# Patient Record
Sex: Male | Born: 1960
Health system: Southern US, Community
[De-identification: ages and names within clinical notes are randomized; demographics above are authoritative.]

## PROBLEM LIST (undated history)

## (undated) DIAGNOSIS — I10 Essential (primary) hypertension: Secondary | ICD-10-CM

## (undated) DIAGNOSIS — I639 Cerebral infarction, unspecified: Secondary | ICD-10-CM

## (undated) DIAGNOSIS — I480 Paroxysmal atrial fibrillation: Secondary | ICD-10-CM

## (undated) DIAGNOSIS — I249 Acute ischemic heart disease, unspecified: Secondary | ICD-10-CM

## (undated) DIAGNOSIS — E119 Type 2 diabetes mellitus without complications: Secondary | ICD-10-CM

## (undated) DIAGNOSIS — M47816 Spondylosis without myelopathy or radiculopathy, lumbar region: Secondary | ICD-10-CM

## (undated) DIAGNOSIS — F1910 Other psychoactive substance abuse, uncomplicated: Secondary | ICD-10-CM

## (undated) DIAGNOSIS — I2510 Atherosclerotic heart disease of native coronary artery without angina pectoris: Secondary | ICD-10-CM

## (undated) DIAGNOSIS — E785 Hyperlipidemia, unspecified: Secondary | ICD-10-CM

## (undated) HISTORY — DX: Cerebral infarction, unspecified: I63.9

## (undated) HISTORY — DX: Essential (primary) hypertension: I10

## (undated) HISTORY — DX: Paroxysmal atrial fibrillation: I48.0

## (undated) HISTORY — DX: Type 2 diabetes mellitus without complications: E11.9

## (undated) HISTORY — DX: Acute ischemic heart disease, unspecified: I24.9

## (undated) HISTORY — DX: Atherosclerotic heart disease of native coronary artery without angina pectoris: I25.10

## (undated) HISTORY — DX: Other psychoactive substance abuse, uncomplicated: F19.10

## (undated) HISTORY — PX: LYMPH NODE BIOPSY: SHX201

## (undated) HISTORY — DX: Spondylosis without myelopathy or radiculopathy, lumbar region: M47.816

## (undated) HISTORY — DX: Hyperlipidemia, unspecified: E78.5

---

## 2003-02-24 ENCOUNTER — Emergency Department (HOSPITAL_COMMUNITY): Admission: EM | Admit: 2003-02-24 | Discharge: 2003-02-24 | Payer: Self-pay | Admitting: Emergency Medicine

## 2013-03-15 ENCOUNTER — Encounter: Payer: Self-pay | Admitting: Family Medicine

## 2013-03-15 ENCOUNTER — Ambulatory Visit (INDEPENDENT_AMBULATORY_CARE_PROVIDER_SITE_OTHER): Payer: 59 | Admitting: Family Medicine

## 2013-03-15 VITALS — BP 130/70 | HR 78 | Temp 97.4°F | Resp 20 | Ht 72.0 in | Wt 220.0 lb

## 2013-03-15 DIAGNOSIS — M2540 Effusion, unspecified joint: Secondary | ICD-10-CM

## 2013-03-15 DIAGNOSIS — F17200 Nicotine dependence, unspecified, uncomplicated: Secondary | ICD-10-CM

## 2013-03-15 DIAGNOSIS — R490 Dysphonia: Secondary | ICD-10-CM

## 2013-03-15 DIAGNOSIS — E119 Type 2 diabetes mellitus without complications: Secondary | ICD-10-CM

## 2013-03-15 DIAGNOSIS — E785 Hyperlipidemia, unspecified: Secondary | ICD-10-CM

## 2013-03-15 DIAGNOSIS — M25511 Pain in right shoulder: Secondary | ICD-10-CM

## 2013-03-15 DIAGNOSIS — M25519 Pain in unspecified shoulder: Secondary | ICD-10-CM

## 2013-03-15 DIAGNOSIS — Z720 Tobacco use: Secondary | ICD-10-CM

## 2013-03-15 DIAGNOSIS — I10 Essential (primary) hypertension: Secondary | ICD-10-CM

## 2013-03-15 DIAGNOSIS — M25549 Pain in joints of unspecified hand: Secondary | ICD-10-CM

## 2013-03-15 DIAGNOSIS — M79643 Pain in unspecified hand: Secondary | ICD-10-CM

## 2013-03-15 LAB — COMPREHENSIVE METABOLIC PANEL
AST: 17 U/L (ref 0–37)
Alkaline Phosphatase: 99 U/L (ref 39–117)
BUN: 9 mg/dL (ref 6–23)
Creat: 0.96 mg/dL (ref 0.50–1.35)
Potassium: 4.3 mEq/L (ref 3.5–5.3)
Total Bilirubin: 0.5 mg/dL (ref 0.3–1.2)

## 2013-03-15 LAB — CBC WITH DIFFERENTIAL/PLATELET
Basophils Relative: 0 % (ref 0–1)
Eosinophils Absolute: 0.1 10*3/uL (ref 0.0–0.7)
HCT: 37.9 % — ABNORMAL LOW (ref 39.0–52.0)
Hemoglobin: 12.9 g/dL — ABNORMAL LOW (ref 13.0–17.0)
Lymphs Abs: 3 10*3/uL (ref 0.7–4.0)
MCH: 28.2 pg (ref 26.0–34.0)
MCHC: 34 g/dL (ref 30.0–36.0)
MCV: 82.9 fL (ref 78.0–100.0)
Monocytes Absolute: 0.5 10*3/uL (ref 0.1–1.0)
Monocytes Relative: 6 % (ref 3–12)

## 2013-03-15 LAB — HEMOGLOBIN A1C: Mean Plasma Glucose: 146 mg/dL — ABNORMAL HIGH (ref <117)

## 2013-03-15 LAB — LIPID PANEL
Cholesterol: 144 mg/dL (ref 0–200)
HDL: 22 mg/dL — ABNORMAL LOW (ref >39)
Total CHOL/HDL Ratio: 6.5 Ratio
Triglycerides: 118 mg/dL (ref <150)
VLDL: 24 mg/dL (ref 0–40)

## 2013-03-15 NOTE — Patient Instructions (Addendum)
Come back on next Thursday for labs and medications Bring your meter  Take the ultram at bedtime for arthritis pain Get the xray done on your hands

## 2013-03-16 LAB — MICROALBUMIN / CREATININE URINE RATIO
Creatinine, Urine: 159.3 mg/dL
Microalb, Ur: 0.78 mg/dL (ref 0.00–1.89)

## 2013-03-18 ENCOUNTER — Encounter: Payer: Self-pay | Admitting: Family Medicine

## 2013-03-18 DIAGNOSIS — I10 Essential (primary) hypertension: Secondary | ICD-10-CM | POA: Insufficient documentation

## 2013-03-18 DIAGNOSIS — M25511 Pain in right shoulder: Secondary | ICD-10-CM | POA: Insufficient documentation

## 2013-03-18 DIAGNOSIS — M25512 Pain in left shoulder: Secondary | ICD-10-CM | POA: Insufficient documentation

## 2013-03-18 DIAGNOSIS — M2540 Effusion, unspecified joint: Secondary | ICD-10-CM | POA: Insufficient documentation

## 2013-03-18 DIAGNOSIS — Z720 Tobacco use: Secondary | ICD-10-CM | POA: Insufficient documentation

## 2013-03-18 DIAGNOSIS — E785 Hyperlipidemia, unspecified: Secondary | ICD-10-CM | POA: Insufficient documentation

## 2013-03-18 NOTE — Assessment & Plan Note (Addendum)
Bilateral hand swelling. I think this is due to some arthritis and overuse with his job. I will have him obtain x-rays as he does have some deformities of the MIP bilateral Advised to use tramadol as needed, he can take acetaminophen during the day

## 2013-03-18 NOTE — Assessment & Plan Note (Signed)
Counseled on tobacco cessation 

## 2013-03-18 NOTE — Assessment & Plan Note (Signed)
Check fasting lipid panel he has not been taking his Zocor

## 2013-03-18 NOTE — Assessment & Plan Note (Signed)
Despite not having blood pressure medications his blood pressure looks okay today. I will be in lieu of stopping the amlodipine and putting him on an ACE inhibitor with his diabetes mellitus

## 2013-03-18 NOTE — Progress Notes (Signed)
  Subjective:    Patient ID: Aaron Medina, male    DOB: 09-21-1961, 52 y.o.   MRN: [id number]  HPI  Patient here to establish care. Previous PCP [facility]. Past medical history reviewed/family history/medications reviewed. He has a history of diabetes mellitus hypertension hyperlipidemia he has not taken any medications for the past 3 months because he thought he was taking too much medication . He was seen in urgent care last week secondary to on: Muscle aches fatigue and fever he was diagnosed with influenza. He was not prescribed any medications. He is a smoker however denies any cough with production. He states he still a little fatigued and has a hoarse for his but otherwise feels well.  He complains of bilateral shoulder pain worse with rotating motion that he must do multiple times throughout the day with his job. He also gets hand swelling and pain in his joints and he has missed work due to severe pain. He has no family history of rheumatoid arthritis. He was prescribed tramadol based on review of his prescriptions however this made him drowsy so he did not take it because of work. He's had a small nodule his right hand second digit for many years. He also has broken bones in both fifth digits resulting in contractures.  He has a very remote history of substance abuse he was evaluated by [facility] secondary to chest pain based on patient's description cocaine-induced vasospasm.  He is overdue for fasting blood work.  She's never had a colonoscopy Is never had prostate screening or PSA Review of Systems - per above  GEN- denies fatigue, fever, weight loss,weakness, recent illness HEENT- denies eye drainage, change in vision, nasal discharge, CVS- denies chest pain, palpitations RESP- denies SOB, cough, wheeze ABD- denies N/V, change in stools, abd pain GU- denies dysuria, hematuria, dribbling, incontinence MSK- + joint pain, muscle aches, injury Neuro-  denies headache, dizziness, syncope, seizure activity Endo- denies polyuria, polydipsia Pysch- denies depression/anxiety symptoms     Objective:   Physical Exam GEN- NAD, alert and oriented x3 HEENT- PERRL, EOMI, non injected sclera, pink conjunctiva, MMM, oropharynx clear, hoarse voice Neck- Supple, no LAD CVS- RRR, no murmur RESP-CTAB ABD-NABS,soft,NT,ND EXT- No edema Pulses- Radial, DP- 2+ Diabetic foot exam below MSK- RIght hand- swelling of MIP, nodule Right hand 2nd digit at MIP, contracture 5th digits bilat, able to make fist bilat, good grasp bilat - Shoulder- bilat normal inspection, Rotator cuff in tact, biceps in tact, strength equal bilat Psych- normal affect and mood       Assessment & Plan:

## 2013-03-18 NOTE — Assessment & Plan Note (Signed)
This is an improvement but residual from her recent viral illness. We will recheck next week

## 2013-03-18 NOTE — Assessment & Plan Note (Signed)
His medication bottles are from March 2014 he also has a bottle metformin from 2013. He's not been taking medications as prescribed as he did not feel he needed all the medications. He has not been checking his blood sugar. He will have fasting labs today to return in one week's time so we can discuss proper management Urine microalbumin sent Overdue for eye exam

## 2013-03-18 NOTE — Assessment & Plan Note (Signed)
Normal examination. Think this is due to repetitive motions with his job

## 2013-03-21 ENCOUNTER — Ambulatory Visit (INDEPENDENT_AMBULATORY_CARE_PROVIDER_SITE_OTHER): Payer: 59 | Admitting: Family Medicine

## 2013-03-21 VITALS — BP 142/94 | HR 100 | Temp 97.8°F | Resp 20 | Wt 222.0 lb

## 2013-03-21 DIAGNOSIS — D75839 Thrombocytosis, unspecified: Secondary | ICD-10-CM

## 2013-03-21 DIAGNOSIS — D473 Essential (hemorrhagic) thrombocythemia: Secondary | ICD-10-CM

## 2013-03-21 DIAGNOSIS — E119 Type 2 diabetes mellitus without complications: Secondary | ICD-10-CM

## 2013-03-21 DIAGNOSIS — I10 Essential (primary) hypertension: Secondary | ICD-10-CM

## 2013-03-21 DIAGNOSIS — E785 Hyperlipidemia, unspecified: Secondary | ICD-10-CM

## 2013-03-21 LAB — CBC
HCT: 40.7 % (ref 39.0–52.0)
Hemoglobin: 13.3 g/dL (ref 13.0–17.0)
MCHC: 32.7 g/dL (ref 30.0–36.0)
MCV: 84.8 fL (ref 78.0–100.0)
WBC: 7.1 10*3/uL (ref 4.0–10.5)

## 2013-03-21 LAB — IRON AND TIBC
TIBC: 284 ug/dL (ref 215–435)
UIBC: 237 ug/dL (ref 125–400)

## 2013-03-21 LAB — FERRITIN: Ferritin: 227 ng/mL (ref 22–322)

## 2013-03-21 MED ORDER — LISINOPRIL 10 MG PO TABS: 10.0000 mg | ORAL_TABLET | Freq: Every day | ORAL | Status: DC

## 2013-03-21 NOTE — Assessment & Plan Note (Signed)
Cholesterol panel looks great off of medication for many months. His LDL is below 100 his total cholesterol is 166. I will hold on the statin therapy

## 2013-03-21 NOTE — Assessment & Plan Note (Signed)
His blood pressure is still elevated. I will switch him to lisinopril 10 mg daily, as he does have diabetes mellitus

## 2013-03-21 NOTE — Patient Instructions (Signed)
No diabetes medications needed No cholesterol medication needed Start the new blood pressure medication lisinopril Get the xray of your hands done F/U 3 months in office Come 1 week before for labs - give appt    Diets for Diabetes, Food Labeling Look at food labels to help you decide how much of a product you can eat. You will want to check the amount of total carbohydrate in a serving to see how the food fits into your meal plan. In the list of ingredients, the ingredient present in the largest amount by weight must be listed first, followed by the other ingredients in descending order. STANDARD OF IDENTITY Most products have a list of ingredients. However, foods that the Food and Drug Administration (FDA) has given a standard of identity do not need a list of ingredients. A standard of identity means that a food must contain certain ingredients if it is called a particular name. Examples are mayonnaise, peanut butter, ketchup, jelly, and cheese. LABELING TERMS There are many terms found on food labels. Some of these terms have specific definitions. Some terms are regulated by the FDA, and the FDA has clearly specified how they can be used. Others are not regulated or well-defined and can be misleading and confusing. SPECIFICALLY DEFINED TERMS Nutritive Sweetener.  A sweetener that contains calories,such as table sugar or honey. Nonnutritive Sweetener.  A sweetener with few or no calories,such as saccharin, aspartame, sucralose, and cyclamate. LABELING TERMS REGULATED BY THE FDA Free.  The product contains only a tiny or small amount of fat, cholesterol, sodium, sugar, or calories. For example, a "fat-free" product will contain less than 0.5 g of fat per serving. Low.  A food described as "low" in fat, saturated fat, cholesterol, sodium, or calories could be eaten fairly often without exceeding dietary guidelines. For example, "low in fat" means no more than 3 g of fat per  serving. Lean.  "Lean" and "extra lean" are U.S. Department of Agriculture Architect) terms for use on meat and poultry products. "Lean" means the product contains less than 10 g of fat, 4 g of saturated fat, and 95 mg of cholesterol per serving. "Lean" is not as low in fat as a product labeled "low." Extra Lean.  "Extra lean" means the product contains less than 5 g of fat, 2 g of saturated fat, and 95 mg of cholesterol per serving. While "extra lean" has less fat than "lean," it is still higher in fat than a product labeled "low." Reduced, Less, Fewer.  A diet product that contains 25% less of a nutrient or calories than the regular version. For example, hot dogs might be labeled "25% less fat than our regular hot dogs." Light/Lite.  A diet product that contains  fewer calories or  the fat of the original. For example, "light in sodium" means a product with  the usual sodium. More.  One serving contains at least 10% more of the daily value of a vitamin, mineral, or fiber than usual. Good Source Of.  One serving contains 10% to 19% of the daily value for a particular vitamin, mineral, or fiber. Excellent Source Of.  One serving contains 20% or more of the daily value for a particular nutrient. Other terms used might be "high in" or "rich in." Enriched or Fortified.  The product contains added vitamins, minerals, or protein. Nutrition labeling must be used on enriched or fortified foods. Imitation.  The product has been altered so that it is lower in protein, vitamins,  or minerals than the usual food,such as imitation peanut butter. Total Fat.  The number listed is the total of all fat found in a serving of the product. Under total fat, food labels must list saturated fat and trans fat, which are associated with raising bad cholesterol and an increased risk of heart blood vessel disease. Saturated Fat.  Mainly fats from animal-based sources. Some examples are red meat, cheese, cream,  whole milk, and coconut oil. Trans Fat.  Found in some fried snack foods, packaged foods, and fried restaurant foods. It is recommended you eat as close to 0 g of trans fat as possible, since it raises bad cholesterol and lowers good cholesterol. Polyunsaturated and Monounsaturated Fats.  More healthful fats. These fats are from plant sources. Total Carbohydrate.  The number of carbohydrate grams in a serving of the product. Under total carbohydrate are listed the other carbohydrate sources, such as dietary fiber and sugars. Dietary Fiber.  A carbohydrate from plant sources. Sugars.  Sugars listed on the label contain all naturally occurring sugars as well as added sugars. LABELING TERMS NOT REGULATED BY THE FDA Sugarless.  Table sugar (sucrose) has not been added. However, the manufacturer may use another form of sugar in place of sucrose to sweeten the product. For example, sugar alcohols are used to sweeten foods. Sugar alcohols are a form of sugar but are not table sugar. If a product contains sugar alcohols in place of sucrose, it can still be labeled "sugarless." Low Salt, Salt-Free, Unsalted, No Salt, No Salt Added, Without Added Salt.  Food that is usually processed with salt has been made without salt. However, the food may contain sodium-containing additives, such as preservatives, leavening agents, or flavorings. Natural.  This term has no legal meaning. Organic.  Foods that are certified as organic have been inspected and approved by the USDA to ensure they are produced without pesticides, fertilizers containing synthetic ingredients, bioengineering, or ionizing radiation. Document Released: 09/29/2003 Document Revised: 12/19/2011 Document Reviewed: 04/16/2009 [facility] Patient Information 2014 [place], [place].

## 2013-03-21 NOTE — Assessment & Plan Note (Signed)
His hemoglobin came back at 12.9 which is right at the cutoff. I will check for iron deficiency anemia this may be due to his smoking as well. CBC to be rechecked iron TIBC ferritin level

## 2013-03-21 NOTE — Progress Notes (Signed)
  Subjective:    Patient ID: Aaron Medina, male    DOB: 09-27-1961, 52 y.o.   MRN: [id number]  HPI  Patient here to followup lab results from last week's initial establishment visit. His history diabetes mellitus hyperlipidemia and hypertension. He has not taken medications in over 3 months. He did not bring his meter with him today. He's not had x-rays of his hands   Labs reviewed in detail at the bedside. He was noted to have thrombocytosis Review of Systems - per above   GEN- denies fatigue, fever, weight loss,weakness, recent illness      Objective:   Physical Exam  GEN- NAD, alert and oriented x3         Assessment & Plan:

## 2013-03-21 NOTE — Assessment & Plan Note (Addendum)
Diabetes mellitus diet controlled I do not think he needs to metformin or the glipizide at this time. We reviewed diet in detail. He was also given handout. We will recheck his A1c in 3 month  approx 20 minutes spent with pt > 50% spent on counseling diet/DM and medication reconciliation, lab review

## 2013-03-23 ENCOUNTER — Emergency Department (HOSPITAL_COMMUNITY): Payer: Self-pay

## 2013-03-23 ENCOUNTER — Emergency Department (HOSPITAL_COMMUNITY): Admission: EM | Admit: 2013-03-23 | Discharge: 2013-03-23 | Discharge status: Against Medical Advice | Payer: Self-pay

## 2013-03-23 ENCOUNTER — Ambulatory Visit (HOSPITAL_COMMUNITY)
Admission: RE | Admit: 2013-03-23 | Discharge: 2013-03-23 | Disposition: A | Discharge status: Home / Self Care | Payer: 59 | Source: Ambulatory Visit | Attending: Family Medicine | Admitting: Family Medicine

## 2013-03-23 DIAGNOSIS — M2540 Effusion, unspecified joint: Secondary | ICD-10-CM

## 2013-03-23 DIAGNOSIS — M79643 Pain in unspecified hand: Secondary | ICD-10-CM

## 2013-03-23 DIAGNOSIS — M7989 Other specified soft tissue disorders: Secondary | ICD-10-CM | POA: Insufficient documentation

## 2013-03-23 DIAGNOSIS — M79609 Pain in unspecified limb: Secondary | ICD-10-CM | POA: Insufficient documentation

## 2013-03-27 ENCOUNTER — Encounter: Payer: Self-pay | Admitting: Family Medicine

## 2013-06-21 ENCOUNTER — Encounter: Payer: Self-pay | Admitting: Family Medicine

## 2013-06-21 ENCOUNTER — Telehealth: Payer: Self-pay | Admitting: Family Medicine

## 2013-06-21 ENCOUNTER — Ambulatory Visit (INDEPENDENT_AMBULATORY_CARE_PROVIDER_SITE_OTHER): Payer: 59 | Admitting: Family Medicine

## 2013-06-21 VITALS — BP 150/80 | HR 78 | Temp 97.5°F | Resp 18 | Wt 226.0 lb

## 2013-06-21 DIAGNOSIS — Z23 Encounter for immunization: Secondary | ICD-10-CM

## 2013-06-21 DIAGNOSIS — E785 Hyperlipidemia, unspecified: Secondary | ICD-10-CM

## 2013-06-21 DIAGNOSIS — D75839 Thrombocytosis, unspecified: Secondary | ICD-10-CM

## 2013-06-21 DIAGNOSIS — D473 Essential (hemorrhagic) thrombocythemia: Secondary | ICD-10-CM

## 2013-06-21 DIAGNOSIS — E119 Type 2 diabetes mellitus without complications: Secondary | ICD-10-CM

## 2013-06-21 DIAGNOSIS — M1990 Unspecified osteoarthritis, unspecified site: Secondary | ICD-10-CM

## 2013-06-21 DIAGNOSIS — I10 Essential (primary) hypertension: Secondary | ICD-10-CM

## 2013-06-21 LAB — CBC WITH DIFFERENTIAL/PLATELET
HCT: 43.8 % (ref 39.0–52.0)
Hemoglobin: 14.9 g/dL (ref 13.0–17.0)
Lymphocytes Relative: 45 % (ref 12–46)
Lymphs Abs: 2.9 10*3/uL (ref 0.7–4.0)
MCHC: 34 g/dL (ref 30.0–36.0)
Monocytes Absolute: 0.5 10*3/uL (ref 0.1–1.0)
Monocytes Relative: 7 % (ref 3–12)
Neutro Abs: 2.9 10*3/uL (ref 1.7–7.7)
RBC: 5.25 MIL/uL (ref 4.22–5.81)
WBC: 6.5 10*3/uL (ref 4.0–10.5)

## 2013-06-21 LAB — LIPID PANEL
Cholesterol: 155 mg/dL (ref 0–200)
HDL: 33 mg/dL — ABNORMAL LOW (ref >39)
LDL Cholesterol: 103 mg/dL — ABNORMAL HIGH (ref 0–99)
Total CHOL/HDL Ratio: 4.7 Ratio
Triglycerides: 93 mg/dL (ref <150)
VLDL: 19 mg/dL (ref 0–40)

## 2013-06-21 LAB — BASIC METABOLIC PANEL
CO2: 26 mEq/L (ref 19–32)
Calcium: 9.3 mg/dL (ref 8.4–10.5)
Sodium: 138 mEq/L (ref 135–145)

## 2013-06-21 LAB — HEMOGLOBIN A1C
Hgb A1c MFr Bld: 7.5 % — ABNORMAL HIGH (ref <5.7)
Mean Plasma Glucose: 169 mg/dL — ABNORMAL HIGH (ref <117)

## 2013-06-21 MED ORDER — LISINOPRIL 20 MG PO TABS: 20.0000 mg | ORAL_TABLET | Freq: Every day | ORAL | Status: DC

## 2013-06-21 MED ORDER — TRAMADOL HCL 50 MG PO TABS: 50.0000 mg | ORAL_TABLET | Freq: Three times a day (TID) | ORAL | Status: DC | PRN

## 2013-06-21 NOTE — Telephone Encounter (Signed)
erroe

## 2013-06-21 NOTE — Patient Instructions (Addendum)
Blood pressure medication increased to 20mg  once a day Ultram for arthritis pain We will with lab results  Pneumonia shot given F/U 3 months

## 2013-06-21 NOTE — Progress Notes (Signed)
  Subjective:    Patient ID: Aaron Medina, male    DOB: 12-17-1960, 52 y.o.   MRN: [id number]  HPI  Patient here to follow chronic medical problems. He was taken off of his medications at the last visit. He's currently taking lisinopril 10 mg for his blood pressure and trying to control his diabetes with his diet. He is due for repeat fasting labs today. He continues to complain of joint pain in his out of his tramadol. He after days of work he has knee pain and shoulder pain typically relieved by ibuprofen and icing. He has had no new injuries from our last visit.   Review of Systems  GEN- denies fatigue, fever, weight loss,weakness, recent illness HEENT- denies eye drainage, change in vision, nasal discharge, CVS- denies chest pain, palpitations RESP- denies SOB, cough, wheeze ABD- denies N/V, change in stools, abd pain GU- denies dysuria, hematuria, dribbling, incontinence MSK- + joint pain, muscle aches, injury Neuro- denies headache, dizziness, syncope, seizure activity      Objective:   Physical Exam  GEN- NAD, alert and oriented x3 HEENT- PERRL, EOMI, non injected sclera, pink conjunctiva, MMM, oropharynx clear, hoarse voice Neck- Supple, no LAD CVS- RRR, no murmur RESP-CTAB EXT- No edema Pulses- Radial, DP- 2+      Assessment & Plan:

## 2013-06-23 DIAGNOSIS — M1990 Unspecified osteoarthritis, unspecified site: Secondary | ICD-10-CM | POA: Insufficient documentation

## 2013-06-23 NOTE — Assessment & Plan Note (Signed)
Repeat A1C today On ACEI, ASA Pneumovaccine given Declines flu shot

## 2013-06-23 NOTE — Assessment & Plan Note (Signed)
Ultram and OTC NSAIDS as needed

## 2013-06-23 NOTE — Assessment & Plan Note (Signed)
Uncontrolled, increase lisinopril to 20mg 

## 2013-06-23 NOTE — Assessment & Plan Note (Signed)
Repeat FLP today, off zocor, goal LDL 100

## 2013-06-23 NOTE — Assessment & Plan Note (Signed)
Repeat labs today

## 2013-06-24 MED ORDER — METFORMIN HCL ER 500 MG PO TB24: 500.0000 mg | ORAL_TABLET | Freq: Every day | ORAL | Status: DC

## 2013-06-24 NOTE — Addendum Note (Signed)
Addended by: Milinda Antis F on: 06/24/2013 05:24 PM   Modules accepted: Orders

## 2013-10-10 DIAGNOSIS — I639 Cerebral infarction, unspecified: Secondary | ICD-10-CM

## 2013-10-10 HISTORY — DX: Cerebral infarction, unspecified: I63.9

## 2013-10-25 ENCOUNTER — Telehealth: Payer: Self-pay | Admitting: *Deleted

## 2013-10-25 MED ORDER — LISINOPRIL 20 MG PO TABS
20.0000 mg | ORAL_TABLET | Freq: Every day | ORAL | Status: DC
Start: 2013-10-25 — End: 2014-02-12

## 2013-10-25 NOTE — Telephone Encounter (Signed)
Meds refilled.

## 2014-01-10 ENCOUNTER — Encounter (HOSPITAL_COMMUNITY): Payer: Self-pay | Admitting: Emergency Medicine

## 2014-01-10 ENCOUNTER — Inpatient Hospital Stay (HOSPITAL_COMMUNITY): Payer: 59

## 2014-01-10 ENCOUNTER — Encounter: Payer: Self-pay | Admitting: *Deleted

## 2014-01-10 ENCOUNTER — Inpatient Hospital Stay (HOSPITAL_COMMUNITY)
Admission: EM | Admit: 2014-01-10 | Discharge: 2014-01-14 | DRG: 065 | Disposition: A | Discharge status: Home / Self Care | Payer: 59 | Attending: Internal Medicine | Admitting: Internal Medicine

## 2014-01-10 ENCOUNTER — Telehealth: Payer: Self-pay | Admitting: *Deleted

## 2014-01-10 ENCOUNTER — Emergency Department (HOSPITAL_COMMUNITY): Payer: 59

## 2014-01-10 DIAGNOSIS — F1210 Cannabis abuse, uncomplicated: Secondary | ICD-10-CM | POA: Diagnosis present

## 2014-01-10 DIAGNOSIS — Z91199 Patient's noncompliance with other medical treatment and regimen due to unspecified reason: Secondary | ICD-10-CM

## 2014-01-10 DIAGNOSIS — R4789 Other speech disturbances: Secondary | ICD-10-CM | POA: Diagnosis present

## 2014-01-10 DIAGNOSIS — Z720 Tobacco use: Secondary | ICD-10-CM | POA: Diagnosis present

## 2014-01-10 DIAGNOSIS — Z8673 Personal history of transient ischemic attack (TIA), and cerebral infarction without residual deficits: Secondary | ICD-10-CM | POA: Diagnosis present

## 2014-01-10 DIAGNOSIS — Z9861 Coronary angioplasty status: Secondary | ICD-10-CM

## 2014-01-10 DIAGNOSIS — E1165 Type 2 diabetes mellitus with hyperglycemia: Secondary | ICD-10-CM

## 2014-01-10 DIAGNOSIS — IMO0001 Reserved for inherently not codable concepts without codable children: Secondary | ICD-10-CM | POA: Diagnosis present

## 2014-01-10 DIAGNOSIS — M1990 Unspecified osteoarthritis, unspecified site: Secondary | ICD-10-CM

## 2014-01-10 DIAGNOSIS — I4891 Unspecified atrial fibrillation: Secondary | ICD-10-CM

## 2014-01-10 DIAGNOSIS — R490 Dysphonia: Secondary | ICD-10-CM

## 2014-01-10 DIAGNOSIS — I480 Paroxysmal atrial fibrillation: Secondary | ICD-10-CM | POA: Diagnosis present

## 2014-01-10 DIAGNOSIS — R414 Neurologic neglect syndrome: Secondary | ICD-10-CM | POA: Diagnosis present

## 2014-01-10 DIAGNOSIS — E119 Type 2 diabetes mellitus without complications: Secondary | ICD-10-CM

## 2014-01-10 DIAGNOSIS — R269 Unspecified abnormalities of gait and mobility: Secondary | ICD-10-CM | POA: Diagnosis present

## 2014-01-10 DIAGNOSIS — I2510 Atherosclerotic heart disease of native coronary artery without angina pectoris: Secondary | ICD-10-CM | POA: Diagnosis present

## 2014-01-10 DIAGNOSIS — G8190 Hemiplegia, unspecified affecting unspecified side: Secondary | ICD-10-CM | POA: Diagnosis present

## 2014-01-10 DIAGNOSIS — Z9119 Patient's noncompliance with other medical treatment and regimen: Secondary | ICD-10-CM

## 2014-01-10 DIAGNOSIS — R29898 Other symptoms and signs involving the musculoskeletal system: Secondary | ICD-10-CM | POA: Diagnosis present

## 2014-01-10 DIAGNOSIS — E785 Hyperlipidemia, unspecified: Secondary | ICD-10-CM | POA: Diagnosis present

## 2014-01-10 DIAGNOSIS — I639 Cerebral infarction, unspecified: Secondary | ICD-10-CM

## 2014-01-10 DIAGNOSIS — I10 Essential (primary) hypertension: Secondary | ICD-10-CM | POA: Diagnosis present

## 2014-01-10 DIAGNOSIS — F1910 Other psychoactive substance abuse, uncomplicated: Secondary | ICD-10-CM | POA: Diagnosis present

## 2014-01-10 DIAGNOSIS — Z79899 Other long term (current) drug therapy: Secondary | ICD-10-CM

## 2014-01-10 DIAGNOSIS — I252 Old myocardial infarction: Secondary | ICD-10-CM

## 2014-01-10 DIAGNOSIS — R471 Dysarthria and anarthria: Secondary | ICD-10-CM | POA: Diagnosis present

## 2014-01-10 DIAGNOSIS — Z833 Family history of diabetes mellitus: Secondary | ICD-10-CM

## 2014-01-10 DIAGNOSIS — F17200 Nicotine dependence, unspecified, uncomplicated: Secondary | ICD-10-CM | POA: Diagnosis present

## 2014-01-10 DIAGNOSIS — R55 Syncope and collapse: Secondary | ICD-10-CM | POA: Diagnosis not present

## 2014-01-10 DIAGNOSIS — R931 Abnormal findings on diagnostic imaging of heart and coronary circulation: Secondary | ICD-10-CM

## 2014-01-10 DIAGNOSIS — I6350 Cerebral infarction due to unspecified occlusion or stenosis of unspecified cerebral artery: Principal | ICD-10-CM | POA: Diagnosis present

## 2014-01-10 DIAGNOSIS — Z7982 Long term (current) use of aspirin: Secondary | ICD-10-CM

## 2014-01-10 LAB — GLUCOSE, CAPILLARY
GLUCOSE-CAPILLARY: 107 mg/dL — AB (ref 70–99)
Glucose-Capillary: 151 mg/dL — ABNORMAL HIGH (ref 70–99)

## 2014-01-10 LAB — BASIC METABOLIC PANEL
BUN: 11 mg/dL (ref 6–23)
CALCIUM: 9.3 mg/dL (ref 8.4–10.5)
CO2: 25 meq/L (ref 19–32)
Chloride: 100 mEq/L (ref 96–112)
Creatinine, Ser: 1 mg/dL (ref 0.50–1.35)
GFR calc Af Amer: >90 mL/min (ref >90)
GFR calc non Af Amer: 84 mL/min — ABNORMAL LOW (ref >90)
GLUCOSE: 167 mg/dL — AB (ref 70–99)
Potassium: 3.9 mEq/L (ref 3.7–5.3)
SODIUM: 138 meq/L (ref 137–147)

## 2014-01-10 LAB — CBC WITH DIFFERENTIAL/PLATELET
Basophils Absolute: 0 10*3/uL (ref 0.0–0.1)
Basophils Relative: 0 % (ref 0–1)
EOS PCT: 1 % (ref 0–5)
Eosinophils Absolute: 0.1 10*3/uL (ref 0.0–0.7)
HEMATOCRIT: 45.3 % (ref 39.0–52.0)
HEMOGLOBIN: 15.4 g/dL (ref 13.0–17.0)
LYMPHS ABS: 3.1 10*3/uL (ref 0.7–4.0)
LYMPHS PCT: 35 % (ref 12–46)
MCH: 28.9 pg (ref 26.0–34.0)
MCHC: 34 g/dL (ref 30.0–36.0)
MCV: 85.2 fL (ref 78.0–100.0)
MONO ABS: 0.6 10*3/uL (ref 0.1–1.0)
MONOS PCT: 7 % (ref 3–12)
Neutro Abs: 5.1 10*3/uL (ref 1.7–7.7)
Neutrophils Relative %: 57 % (ref 43–77)
PLATELETS: 293 10*3/uL (ref 150–400)
RBC: 5.32 MIL/uL (ref 4.22–5.81)
RDW: 13.5 % (ref 11.5–15.5)
WBC: 8.8 10*3/uL (ref 4.0–10.5)

## 2014-01-10 LAB — CBG MONITORING, ED: GLUCOSE-CAPILLARY: 104 mg/dL — AB (ref 70–99)

## 2014-01-10 LAB — TROPONIN I: Troponin I: <0.3 ng/mL (ref <0.30)

## 2014-01-10 MED ORDER — INSULIN ASPART 100 UNIT/ML ~~LOC~~ SOLN
0.0000 [IU] | Freq: Three times a day (TID) | SUBCUTANEOUS | Status: DC
  Administered 2014-01-11 – 2014-01-14 (×7): 1 [IU] via SUBCUTANEOUS

## 2014-01-10 MED ORDER — HYDROCODONE-ACETAMINOPHEN 5-325 MG PO TABS
1.0000 | ORAL_TABLET | Freq: Once | ORAL | Status: AC
Start: 2014-01-10 — End: 2014-01-10
  Administered 2014-01-10: 1 via ORAL
  Filled 2014-01-10: qty 1

## 2014-01-10 MED ORDER — ASPIRIN 81 MG PO CHEW
81.0000 mg | CHEWABLE_TABLET | Freq: Every day | ORAL | Status: DC
  Filled 2014-01-10: qty 1

## 2014-01-10 MED ORDER — HYDROCODONE-ACETAMINOPHEN 5-325 MG PO TABS
1.0000 | ORAL_TABLET | Freq: Four times a day (QID) | ORAL | Status: DC | PRN
  Administered 2014-01-10 – 2014-01-14 (×6): 1 via ORAL
  Filled 2014-01-10 (×6): qty 1

## 2014-01-10 MED ORDER — ENOXAPARIN SODIUM 40 MG/0.4ML ~~LOC~~ SOLN
40.0000 mg | SUBCUTANEOUS | Status: DC
  Administered 2014-01-10 – 2014-01-13 (×4): 40 mg via SUBCUTANEOUS
  Filled 2014-01-10 (×4): qty 0.4

## 2014-01-10 MED ORDER — LISINOPRIL 20 MG PO TABS
20.0000 mg | ORAL_TABLET | Freq: Every day | ORAL | Status: DC
  Administered 2014-01-10: 20 mg via ORAL
  Filled 2014-01-10 (×2): qty 1

## 2014-01-10 NOTE — ED Provider Notes (Signed)
CSN: [id number]     Arrival date & time 01/10/14  1057 History   First MD Initiated Contact with Patient 01/10/14 1105     Chief Complaint  Patient presents with  . Weakness     HPI  Patient presents with speech difficulty and weakness. His last time no normal was prior to 12 noon yesterday, 01/09/2014. He awakened feeling normal yesterday.  5 days before he had an episode where his wife had asked him to unlock the car and he had difficulty comprehending and doing this for several minutes. He seemed to resolve. No symptoms over the next several days. Yesterday morning he awakened feeling normally. About noontime he was speaking with a neighbor who told that his speech was slurred. He laid down. His wife got home about 5 PM. He was taken to [facility] and he. He states he was told he was having a TIA. He states his symptoms never resolved. He and his wife state that he is encouraged to stay in the hospital and that "he signed out". He went home. He was having difficulty using his left hand, had difficulty in putting his pants. His speech was abnormal. He waking this morning with worsening with left-sided weakness, and he presents here. History of hypertension, diabetes, 1.5 pack per day smoker.  Past Medical History  Diagnosis Date  . Diabetes mellitus without complication   . Hypertension   . Hyperlipidemia   . Substance abuse     Remote  > 20 years ago, cocaine induced vasospasm   Past Surgical History  Procedure Laterality Date  . Lymph node biopsy     Family History  Problem Relation Age of Onset  . Diabetes Mother   . Cancer Mother     pancreas  . Kidney disease Sister   . Diabetes Sister   . Healthy Brother   . Healthy Daughter   . Healthy Sister   . Healthy Sister   . Healthy Brother   . Healthy Brother   . Healthy Brother   . Healthy Brother    History  Substance Use Topics  . Smoking status: Current Every Day Smoker -- 1.50 packs/day    Types: Cigarettes  .  Smokeless tobacco: Not on file  . Alcohol Use: Yes    Review of Systems  Constitutional: Negative for fever, chills, diaphoresis, appetite change and fatigue.  HENT: Negative for mouth sores, sore throat and trouble swallowing.   Eyes: Negative for visual disturbance.  Respiratory: Negative for cough, chest tightness, shortness of breath and wheezing.   Cardiovascular: Negative for chest pain.  Gastrointestinal: Negative for nausea, vomiting, abdominal pain, diarrhea and abdominal distention.  Endocrine: Negative for polydipsia, polyphagia and polyuria.  Genitourinary: Negative for dysuria, frequency and hematuria.  Musculoskeletal: Negative for gait problem.  Skin: Negative for color change, pallor and rash.  Neurological: Positive for speech difficulty, weakness and headaches. Negative for dizziness, syncope and light-headedness.       Slurred speech. Left arm weakness.  Hematological: Does not bruise/bleed easily.  Psychiatric/Behavioral: Negative for behavioral problems and confusion.      Allergies  Review of patient's allergies indicates not on file.  Home Medications   Current Outpatient Rx  Name  Route  Sig  Dispense  Refill  . aspirin 81 MG tablet   Oral   Take 81 mg by mouth daily.         Marland Kitchen lisinopril (PRINIVIL,ZESTRIL) 20 MG tablet   Oral   Take 1 tablet (20  mg total) by mouth daily.   30 tablet   3     Dose change   . metFORMIN (GLUCOPHAGE-XR) 500 MG 24 hr tablet   Oral   Take 500 mg by mouth daily with supper.         . multivitamin-iron-minerals-folic acid (CENTRUM) chewable tablet   Oral   Chew 1 tablet by mouth at bedtime.          . traMADol (ULTRAM) 50 MG tablet   Oral   Take 1 tablet (50 mg total) by mouth every 8 (eight) hours as needed for pain.   30 tablet   3    BP 179/104  Pulse 69  Temp(Src) 98.4 F (36.9 C) (Oral)  Resp 12  Ht 6\' 1"  (1.854 m)  Wt 225 lb (102.059 kg)  BMI 29.69 kg/m2  SpO2 100% Physical Exam   Constitutional: He is oriented to person, place, and time. He appears well-developed and well-nourished. No distress.  HENT:  Head: Normocephalic.  Slurred speech. He does not have facial droop. Posterior pharynx is benign. No palate abnormalities.  Eyes: Conjunctivae are normal. Pupils are equal, round, and reactive to light. No scleral icterus.  Neck: Normal range of motion. Neck supple. No thyromegaly present.  Cardiovascular: Normal rate and regular rhythm.  Exam reveals no gallop and no friction rub.   No murmur heard. Pulmonary/Chest: Effort normal and breath sounds normal. No respiratory distress. He has no wheezes. He has no rales.  Abdominal: Soft. Bowel sounds are normal. He exhibits no distension. There is no tenderness. There is no rebound.  Musculoskeletal: Normal range of motion.  Neurological: He is alert and oriented to person, place, and time.  He has symmetric cranial nerves. His only exception to this is a slurred speech. Again no abnormalities of the palate. Normal tongue protrusion. Normal facial strength, and sensation. He has a left upper shoulder pronator drift. No obvious demonstrable weakness of his left lower extremity.  Skin: Skin is warm and dry. No rash noted.  Psychiatric: He has a normal mood and affect. His behavior is normal.    ED Course  Procedures (including critical care time) Labs Review Labs Reviewed  BASIC METABOLIC PANEL - Abnormal; Notable for the following:    Glucose, Bld 167 (*)    GFR calc non Af Amer 84 (*)    All other components within normal limits  CBC WITH DIFFERENTIAL  TROPONIN I   Imaging Review Ct Head Wo Contrast  01/10/2014   CLINICAL DATA:  Left arm weakness.  EXAM: CT HEAD WITHOUT CONTRAST  TECHNIQUE: Contiguous axial images were obtained from the base of the skull through the vertex without intravenous contrast.  COMPARISON:  None.  FINDINGS: Focal hypoattenuation involves the right parietal and posterior frontal cortex  extending along the posterior aspect of the sylvian fissure. There is no hemorrhage or mass lesion. Hyperdensity at the right MCA bifurcation may represent acute thrombus. No midline shift is evident. Ventricles are of normal size. No significant extra-axial fluid collection is present.  The paranasal sinuses and mastoid air cells are clear. The osseous skull is intact.  IMPRESSION: 1. Focal hypoattenuation in the posterior right frontal lobe and likely parietal lobe along the posterior aspect of the sylvian fissure is most consistent with an acute nonhemorrhagic infarct.  These results were called by telephone at the time of interpretation on 01/10/2014 at 11:33 AM to Dr. Tanna Furry , who verbally acknowledged these results.   Electronically Signed  By: Lawrence Santiago M.D.   On: 01/10/2014 11:33     EKG Interpretation None      MDM   Final diagnoses:  CVA (cerebral infarction)    CT scan shows right MCA distribution infarct. I discussed the case with hospitalist, Dr. Doyle Askew. Also discussed case with Dr. Wallie Char. Patient be transferred to [facility] where he can undergo neurological consultation. He is well outside the window for TPA. It is not a candidate for acute lytic therapy.    Tanna Furry, MD 01/10/14 1420

## 2014-01-10 NOTE — ED Notes (Signed)
Report given to Jamie, RN.

## 2014-01-10 NOTE — Consult Note (Signed)
Referring Physician: Dr. Doyle Askew     Chief Complaint: New-onset left-sided weakness.  HPI: Aaron Medina is an 53 y.o. male with a history of diabetes mellitus, hypertension and hyperlipidemia who developed onset of acute left side weakness and slurred speech at noon on 01/09/2014. There was no previous history of stroke nor TIA. He was on aspirin 81 mg per day. He did not seek medical attention until today. He woke up and had actual worsening of his weakness on the left side. He went to the emergency room at [facility] where CT scan of his head showed acute right posterior frontal and likely involvement of right parietal region along the posterior aspect of the seventh fissure. He also had carotid Doppler study which showed no significant carotid artery stenosis on either side and antegrade vertebral artery flow bilaterally. NIH stroke score was 4.  LSN: Noon on 01/09/2014 tPA Given: No: Beyond time under for treatment consideration  MRankin: 2  Past Medical History  Diagnosis Date  . Diabetes mellitus without complication   . Hypertension   . Hyperlipidemia   . Substance abuse     Remote  > 20 years ago, cocaine induced vasospasm    Family History  Problem Relation Age of Onset  . Diabetes Mother   . Cancer Mother     pancreas  . Kidney disease Sister   . Diabetes Sister   . Healthy Brother   . Healthy Daughter   . Healthy Sister   . Healthy Sister   . Healthy Brother   . Healthy Brother   . Healthy Brother   . Healthy Brother      Medications: I have reviewed the patient's current medications.  ROS: History obtained from the patient  General ROS: negative for - chills, fatigue, fever, night sweats, weight gain or weight loss Psychological ROS: negative for - behavioral disorder, hallucinations, memory difficulties, mood swings or suicidal ideation Ophthalmic ROS: negative for - blurry vision, double vision, eye pain or loss of vision ENT ROS: negative for - epistaxis,  nasal discharge, oral lesions, sore throat, tinnitus or vertigo Allergy and Immunology ROS: negative for - hives or itchy/watery eyes Hematological and Lymphatic ROS: negative for - bleeding problems, bruising or swollen lymph nodes Endocrine ROS: negative for - galactorrhea, hair pattern changes, polydipsia/polyuria or temperature intolerance Respiratory ROS: negative for - cough, hemoptysis, shortness of breath or wheezing Cardiovascular ROS: negative for - chest pain, dyspnea on exertion, edema or irregular heartbeat Gastrointestinal ROS: negative for - abdominal pain, diarrhea, hematemesis, nausea/vomiting or stool incontinence Genito-Urinary ROS: negative for - dysuria, hematuria, incontinence or urinary frequency/urgency Musculoskeletal ROS: negative for - joint swelling or muscular weakness Neurological ROS: as noted in HPI Dermatological ROS: negative for rash and skin lesion changes  Physical Examination: Blood pressure 167/99, pulse 51, temperature 99.2 F (37.3 C), temperature source Oral, resp. rate 18, height 6\' 1"  (1.854 m), weight 102.059 kg (225 lb), SpO2 99.00%.  Neurologic Examination: Mental Status: Alert, oriented, thought content appropriate.  Speech slightly slurred without evidence of aphasia. Able to follow commands without difficulty. Cranial Nerves: II-Visual fields were normal. III/IV/VI-Pupils were equal and reacted. Extraocular movements were full and conjugate.    V/VII-no facial numbness; mild left lower facial weakness. VIII-normal. X-mild dysarthria; symmetrical palatal movement. Motor: Drift of left upper extremity with mild proximal and distal weakness; mild hip flexor weakness on the left; normal strength of right extremities proximally and distally; normal muscle tone throughout. Sensory: Normal throughout. Deep Tendon Reflexes: 2+  and symmetric. Plantars: Mute bilaterally Cerebellar: Normal finger-to-nose testing.  Ct Head Wo Contrast  01/10/2014    CLINICAL DATA:  Left arm weakness.  EXAM: CT HEAD WITHOUT CONTRAST  TECHNIQUE: Contiguous axial images were obtained from the base of the skull through the vertex without intravenous contrast.  COMPARISON:  None.  FINDINGS: Focal hypoattenuation involves the right parietal and posterior frontal cortex extending along the posterior aspect of the sylvian fissure. There is no hemorrhage or mass lesion. Hyperdensity at the right MCA bifurcation may represent acute thrombus. No midline shift is evident. Ventricles are of normal size. No significant extra-axial fluid collection is present.  The paranasal sinuses and mastoid air cells are clear. The osseous skull is intact.  IMPRESSION: 1. Focal hypoattenuation in the posterior right frontal lobe and likely parietal lobe along the posterior aspect of the sylvian fissure is most consistent with an acute nonhemorrhagic infarct.  These results were called by telephone at the time of interpretation on 01/10/2014 at 11:33 AM to Dr. Tanna Furry , who verbally acknowledged these results.   Electronically Signed   By: Lawrence Santiago M.D.   On: 01/10/2014 11:33   US Carotid Duplex Bilateral  01/10/2014   CLINICAL DATA:  CVA, loss of balance, speech disturbance, history of hypertension and syncopal episode  EXAM: BILATERAL CAROTID DUPLEX ULTRASOUND  TECHNIQUE: Pearline Cables scale imaging, color Doppler and duplex ultrasound were performed of bilateral carotid and vertebral arteries in the neck.  COMPARISON:  CT HEAD W/O CM dated 01/10/2014  FINDINGS: Criteria: Quantification of carotid stenosis is based on velocity parameters that correlate the residual internal carotid diameter with NASCET-based stenosis levels, using the diameter of the distal internal carotid lumen as the denominator for stenosis measurement.  The following velocity measurements were obtained:  RIGHT  ICA:  73/33 cm/sec  CCA:  034/74 cm/sec  SYSTOLIC ICA/CCA RATIO:  2.59  DIASTOLIC ICA/CCA RATIO:  5.63  ECA:  114 cm/sec   LEFT  ICA:  83/33 cm/sec  CCA:  875/64 cm/sec  SYSTOLIC ICA/CCA RATIO:  3.32  DIASTOLIC ICA/CCA RATIO:  9.51  ECA:  121 cm/sec  RIGHT CAROTID ARTERY: There is a very minimal amount of eccentric hypoechoic plaque within the right carotid bulb (images 6 and 20) extending to involve the origin and proximal aspects of the right internal carotid artery (image 28), not resulting in elevated peak systolic velocities within the interrogated course of the right internal carotid artery to suggest a hemodynamically significant stenosis.  RIGHT VERTEBRAL ARTERY:  Antegrade flow  LEFT CAROTID ARTERY: There is a minimal amount of eccentric mixed echogenic plaque within the left carotid bulb (images 42 and 58), extending to involve the origin and proximal aspects of the left internal carotid artery (image 65), not resulting in elevated peak systolic velocities within the interrogated course of the left internal carotid artery to suggest a hemodynamically significant stenosis.  LEFT VERTEBRAL ARTERY:  Antegrade flow  IMPRESSION: Bilateral atherosclerotic plaque, left subjectively greater than right, not resulting in a hemodynamically significant stenosis.   Electronically Signed   By: Sandi Mariscal M.D.   On: 01/10/2014 14:23    Assessment: 53 y.o. male with multiple risk factors for stroke presenting with acute right frontal and likely parietal ischemic infarction.  Stroke Risk Factors - diabetes mellitus, hyperlipidemia and hypertension  Plan: 1. HgbA1c, fasting lipid panel 2. MRI, MRA  of the brain without contrast 3. PT consult, OT consult, Speech consult 4. Echocardiogram 5. Prophylactic therapy-Antiplatelet med: Aspirin  6. Risk  factor modification 7. Telemetry monitoring   C.R. Nicole Kindred, MD [facility] [phone]  01/10/2014, 6:30 PM

## 2014-01-10 NOTE — Telephone Encounter (Signed)
Called pt wife Levada Dy and she is aware to take pt to ER, is going to take him to [facility]. States that she prefers there instead.

## 2014-01-10 NOTE — H&P (Addendum)
[facility] History and Physical  Aaron Medina IHK:[id number] DOB: 1961/01/08 DOA: 01/10/2014  Referring physician: ED physician PCP: Vic Blackbird, MD   Chief Complaint: left arm weakness, unsteady gait   HPI:  Pt is 53 yo male who presented to [facility] ED with main concern of one day duration of left arm weakness, unsteady gait, slurred speech. He explains he went to medical center [facility] and was told he had TIA, he apparently left AMA and came to [facility] today due to worsening symptoms. He denies fevers, chills, chest pain or shortness of breath, no abdominal or urinary concerns, no headaches, visual changes.   In ED, pt hemodynamically stable, vitals stable, CT head with acute stroke as noted below. [facility] asked to admit to telemetry bed and transfer to [facility] requested.   Assessment and Plan: Active Problems: Left upper extremity weakness with slurred speech - will place admission orders to [facility] telemetry bed  - accepting physician will be Dr. - neurology team notified - order MRI brain, Carotid doppler, 2 D ECHO - PT/OT/SLP - A1C, lipid panel, UDS ordered - aspirin for now and advance diet when bedside swallow evaluation passes  HTN - continue Lisinopril DM type 2 - check A1C - hold metformin and place on SSI   Radiological Exams on Admission: Ct Head Wo Contrast   01/10/2014   Focal hypoattenuation in the posterior right frontal lobe and likely parietal lobe along the posterior aspect of the sylvian fissure is most consistent with an acute nonhemorrhagic infarct.   Code Status: Full Family Communication: Pt at bedside Disposition Plan: Admit for further evaluation     Review of Systems:  Constitutional: Negative for fever, chills and malaise/fatigue. Negative for diaphoresis.  HENT: Negative for hearing loss, ear pain, nosebleeds, congestion, sore throat, neck pain, tinnitus and ear discharge.   Eyes: Negative for blurred vision, double vision, photophobia, pain,  discharge and redness.  Respiratory: Negative for cough, hemoptysis, sputum production, shortness of breath, wheezing and stridor.   Cardiovascular: Negative for chest pain, palpitations, orthopnea, claudication and leg swelling.  Gastrointestinal: Negative for nausea, vomiting and abdominal pain. Negative for heartburn, constipation, blood in stool and melena.  Genitourinary: Negative for dysuria, urgency, frequency, hematuria and flank pain.  Musculoskeletal: Negative for myalgias, back pain, joint pain and falls.  Skin: Negative for itching and rash.  Neurological: Per HPI Endo/Heme/Allergies: Negative for environmental allergies and polydipsia. Does not bruise/bleed easily.  Psychiatric/Behavioral: Negative for suicidal ideas. The patient is not nervous/anxious.      Past Medical History  Diagnosis Date  . Diabetes mellitus without complication   . Hypertension   . Hyperlipidemia   . Substance abuse     Remote  > 20 years ago, cocaine induced vasospasm    Past Surgical History  Procedure Laterality Date  . Lymph node biopsy      Social History:  reports that he has been smoking Cigarettes.  He has been smoking about 1.50 packs per day. He does not have any smokeless tobacco history on file. He reports that he drinks alcohol. His drug history is not on file.  Not on File  Family History  Problem Relation Age of Onset  . Diabetes Mother   . Cancer Mother     pancreas  . Kidney disease Sister   . Diabetes Sister   . Healthy Brother   . Healthy Daughter   . Healthy Sister   . Healthy Sister   . Healthy Brother   . Healthy Brother   .  Healthy Brother   . Healthy Brother     Prior to Admission medications   Medication Sig Start Date End Date Taking? Authorizing Provider  aspirin 81 MG tablet Take 81 mg by mouth daily.   Yes Historical Provider, MD  lisinopril (PRINIVIL,ZESTRIL) 20 MG tablet Take 1 tablet (20 mg total) by mouth daily. 10/25/13  Yes Alycia Rossetti, MD   metFORMIN (GLUCOPHAGE-XR) 500 MG 24 hr tablet Take 500 mg by mouth daily with supper. 06/24/13  Yes Alycia Rossetti, MD  multivitamin-iron-minerals-folic acid (CENTRUM) chewable tablet Chew 1 tablet by mouth at bedtime.    Yes Historical Provider, MD  traMADol (ULTRAM) 50 MG tablet Take 1 tablet (50 mg total) by mouth every 8 (eight) hours as needed for pain. 06/21/13  Yes Alycia Rossetti, MD    Physical Exam: Filed Vitals:   01/10/14 1102 01/10/14 1200  BP: 158/108 169/89  Pulse: 84 72  Temp: 98.2 F (36.8 C)   TempSrc: Oral   Resp: 18 15  Height: 6\' 1"  (1.854 m)   Weight: 102.059 kg (225 lb)   SpO2: 97% 100%    Physical Exam  Constitutional: Appears well-developed and well-nourished. No distress.  HENT: Normocephalic. External right and left ear normal. Oropharynx is clear and moist.  Eyes: Conjunctivae and EOM are normal. PERRLA, no scleral icterus.  Neck: Normal ROM. Neck supple. No JVD. No tracheal deviation. No thyromegaly.  CVS: RRR, S1/S2 +, no murmurs, no gallops, no carotid bruit.  Pulmonary: Effort and breath sounds normal, no stridor, rhonchi, wheezes, rales.  Abdominal: Soft. BS +,  no distension, tenderness, rebound or guarding.  Musculoskeletal: Normal range of motion. No edema and no tenderness.  Lymphadenopathy: No lymphadenopathy noted, cervical, inguinal. Neuro: Alert. Normal reflexes, slurred speech, left upper extremity strength 3/5 Skin: Skin is warm and dry. No rash noted. Not diaphoretic. No erythema. No pallor.  Psychiatric: Normal mood and affect. Behavior, judgment, thought content normal.   Labs on Admission:  Basic Metabolic Panel:  Recent Labs Lab 01/10/14 1149  NA 138  K 3.9  CL 100  CO2 25  GLUCOSE 167*  BUN 11  CREATININE 1.00  CALCIUM 9.3   CBC:  Recent Labs Lab 01/10/14 1149  WBC 8.8  NEUTROABS 5.1  HGB 15.4  HCT 45.3  MCV 85.2  PLT 293   Cardiac Enzymes:  Recent Labs Lab 01/10/14 1149  TROPONINI <0.30    EKG:  Normal sinus rhythm, no ST/T wave changes  Faye Ramsay, MD  [facility] Pager [phone]  If 7PM-7AM, please contact night-coverage [email or web address] Password [email or web address] 01/10/2014, 1:16 PM

## 2014-01-10 NOTE — ED Notes (Signed)
Pt seen at [facility] last night, DX with TIA, pt states slurred speech worse, and extremity weakness worse, head pounding per pt.

## 2014-01-10 NOTE — Telephone Encounter (Signed)
pts significant other called stating that she is needing pt to be seen sooner than April 17th, states that pt went to [facility] in [place] done a CT on him came back negative, she states that pt is still have gait problems and feels like he is having a stroke and wants pt to be seen TODAY, I explained to her that Dr.[place] is the only provider here today and that she did not have any openings at this time, I offered pt a Tues appt with an approval from [place] pt still claims that she needs to be seen today because it may be to late if she waits until Tuesday. I spoke to [facility] nurse who will be communicating with [facility] on what we need to do. I went to talk to Dr. Buelah Manis and she agrees that if pt feels like having another stroke,etc then she needs to take him back to ED or UC and have them to reevalaute the pt.

## 2014-01-10 NOTE — Telephone Encounter (Signed)
Please call his WIFE - Enrique Sack, he has risk factors for stroke with his HTN and DM, he needs to have an MRI done, he needs to go to the ER, not an Urgent Care, take him to the closest one.

## 2014-01-10 NOTE — Telephone Encounter (Signed)
This encounter was created in error - please disregard.

## 2014-01-11 DIAGNOSIS — E785 Hyperlipidemia, unspecified: Secondary | ICD-10-CM

## 2014-01-11 DIAGNOSIS — F17200 Nicotine dependence, unspecified, uncomplicated: Secondary | ICD-10-CM

## 2014-01-11 LAB — GLUCOSE, CAPILLARY
Glucose-Capillary: 134 mg/dL — ABNORMAL HIGH (ref 70–99)
Glucose-Capillary: 140 mg/dL — ABNORMAL HIGH (ref 70–99)
Glucose-Capillary: 114 mg/dL — ABNORMAL HIGH (ref 70–99)
Glucose-Capillary: 130 mg/dL — ABNORMAL HIGH (ref 70–99)

## 2014-01-11 LAB — RAPID URINE DRUG SCREEN, HOSP PERFORMED
Amphetamines: NOT DETECTED
Barbiturates: NOT DETECTED
Benzodiazepines: NOT DETECTED
Cocaine: NOT DETECTED
OPIATES: POSITIVE — AB
Tetrahydrocannabinol: POSITIVE — AB

## 2014-01-11 LAB — BASIC METABOLIC PANEL
BUN: 11 mg/dL (ref 6–23)
CALCIUM: 9 mg/dL (ref 8.4–10.5)
CO2: 20 mEq/L (ref 19–32)
Chloride: 98 mEq/L (ref 96–112)
Creatinine, Ser: 0.96 mg/dL (ref 0.50–1.35)
GFR calc Af Amer: >90 mL/min (ref >90)
Glucose, Bld: 123 mg/dL — ABNORMAL HIGH (ref 70–99)
Potassium: 4.1 mEq/L (ref 3.7–5.3)
Sodium: 137 mEq/L (ref 137–147)

## 2014-01-11 LAB — HEMOGLOBIN A1C
HEMOGLOBIN A1C: 7 % — AB (ref <5.7)
Mean Plasma Glucose: 154 mg/dL — ABNORMAL HIGH (ref <117)

## 2014-01-11 LAB — LIPID PANEL
Cholesterol: 159 mg/dL (ref 0–200)
HDL: 32 mg/dL — ABNORMAL LOW (ref >39)
LDL CALC: 103 mg/dL — AB (ref 0–99)
Total CHOL/HDL Ratio: 5 RATIO
Triglycerides: 122 mg/dL (ref <150)
VLDL: 24 mg/dL (ref 0–40)

## 2014-01-11 LAB — CBC
HCT: 46.1 % (ref 39.0–52.0)
Hemoglobin: 16.2 g/dL (ref 13.0–17.0)
MCH: 29.8 pg (ref 26.0–34.0)
MCHC: 35.1 g/dL (ref 30.0–36.0)
MCV: 84.7 fL (ref 78.0–100.0)
PLATELETS: 272 10*3/uL (ref 150–400)
RBC: 5.44 MIL/uL (ref 4.22–5.81)
RDW: 13.7 % (ref 11.5–15.5)
WBC: 8.4 10*3/uL (ref 4.0–10.5)

## 2014-01-11 MED ORDER — HYDRALAZINE HCL 20 MG/ML IJ SOLN
5.0000 mg | INTRAMUSCULAR | Status: DC | PRN
  Administered 2014-01-11: 5 mg via INTRAVENOUS
  Filled 2014-01-11 (×2): qty 1

## 2014-01-11 MED ORDER — ACETAMINOPHEN 325 MG PO TABS
650.0000 mg | ORAL_TABLET | Freq: Four times a day (QID) | ORAL | Status: DC | PRN
  Administered 2014-01-11 (×2): 650 mg via ORAL
  Filled 2014-01-11 (×2): qty 2

## 2014-01-11 MED ORDER — LISINOPRIL 40 MG PO TABS
40.0000 mg | ORAL_TABLET | Freq: Every day | ORAL | Status: DC
  Administered 2014-01-11 – 2014-01-14 (×4): 40 mg via ORAL
  Filled 2014-01-11 (×4): qty 1

## 2014-01-11 MED ORDER — ASPIRIN EC 325 MG PO TBEC
325.0000 mg | DELAYED_RELEASE_TABLET | Freq: Every day | ORAL | Status: DC
  Administered 2014-01-11 – 2014-01-12 (×2): 325 mg via ORAL
  Filled 2014-01-11 (×3): qty 1

## 2014-01-11 NOTE — Evaluation (Signed)
Speech Language Pathology Evaluation Patient Details Name: Aaron Medina MRN: [id number] DOB: 1961/05/13 Today's Date: 01/11/2014 Time: 2947-6546 SLP Time Calculation (min): 28 min  Problem List:  Patient Active Problem List   Diagnosis Date Noted  . Stroke 01/10/2014  . OA (osteoarthritis) 06/23/2013  . Thrombocytosis 03/21/2013  . Type II or unspecified type diabetes mellitus without mention of complication, not stated as uncontrolled 03/18/2013  . Essential hypertension, benign 03/18/2013  . Hyperlipidemia 03/18/2013  . Tobacco use 03/18/2013  . Hoarse voice quality 03/18/2013  . Shoulder pain, bilateral 03/18/2013   Past Medical History:  Past Medical History  Diagnosis Date  . Diabetes mellitus without complication   . Hypertension   . Hyperlipidemia   . Substance abuse     Remote  > 20 years ago, cocaine induced vasospasm   Past Surgical History:  Past Surgical History  Procedure Laterality Date  . Lymph node biopsy     HPI:  53 yo male adm to [facility] with left sided weakness and dysarthria. Pt found to have right frontal and possible parietal cva.  Speech evaluation ordered.    Assessment / Plan / Recommendation Clinical Impression  Pt presents with moderate mixed dysarthria resulting in imprecise articulation, slow rate of speech with monotone output- speech is intelligible but imprecise.   Lack of awareness to dysarthria noted - as pt states he doesn't listen to himself.    Pt with CN deficits impacting VII, ? X, and XII nerves.  He was able to follow multiple step directions, label objects and read sentence level information without deficits.  Pt named 12 animals in 60 seconds requiring category cue x1.    Recommend skilled SLP to maximize speech intelligiblity and to assess/tx higher level cognition given working demands and right frontal cva.    Skilled intervention included educating pt/spouse to results of testing, creation of SLP goals and reinforcement of  effective dysarthria compensation strategies.  Pt initially utilized compensation strategy with mod I assist - but required min verbal/visual cues with spontaneous communication.      SLP Assessment  Patient needs continued Speech Lanaguage Pathology Services    Follow Up Recommendations  Outpatient SLP    Frequency and Duration min 2x/week  1 week   Pertinent Vitals/Pain Afebrile, decreased   SLP Goals  SLP Goals Potential to Achieve Goals: Good  SLP Evaluation Prior Functioning   Lives With: Spouse Available Help at Discharge: Family (wife works full time but is going to Systems analyst) Education: HS diploma Vocation: Full time employment   Cognition  Orientation Level: Oriented X4 Attention: Sustained Sustained Attention: Appears intact Memory: Appears intact Awareness: Impaired Awareness Impairment: Intellectual impairment (pt denies awareness to speech deficits) Problem Solving: Appears intact (for basic - pt stating need to call for assist)    Comprehension  Auditory Comprehension Overall Auditory Comprehension: Appears within functional limits for tasks assessed Yes/No Questions: Not tested Commands: Within Functional Limits Conversation: Complex Visual Recognition/Discrimination Discrimination: Not tested Reading Comprehension Reading Status: Within funtional limits    Expression Expression Primary Mode of Expression: Verbal Verbal Expression Initiation: No impairment Level of Generative/Spontaneous Verbalization: Conversation Repetition: No impairment Naming: No impairment Pragmatics: No impairment Interfering Components: Speech intelligibility Written Expression Dominant Hand: Right Written Expression: Not tested   Oral / Motor Oral Motor/Sensory Function Labial ROM: Reduced left Labial Symmetry: Abnormal symmetry left Labial Strength: Reduced Lingual ROM: Reduced left;Reduced right Lingual Strength: Reduced Facial ROM: Reduced left Velum:   (sluggish) Motor Speech Overall Motor Speech:  Impaired Respiration: Impaired Level of Impairment: Phrase Phonation: Low vocal intensity Motor Planning: Witnin functional limits Motor Speech Errors: Not applicable Effective Techniques: Over-articulate   GO     Claudie Fisherman, [place] [facility] SLP [phone]

## 2014-01-11 NOTE — Progress Notes (Signed)
Stroke Team Progress Note  HISTORY Aaron Medina is a 53 y.o. male with a history of diabetes mellitus, hypertension and hyperlipidemia who developed onset of acute left side weakness and slurred speech at noon on 01/09/2014. There was no previous history of stroke nor TIA. He was on aspirin 81 mg per day. He did not seek medical attention until 01/10/2014. He woke up and had actual worsening of his weakness on the left side. He went to the emergency room at [facility] where CT scan of his head showed acute right posterior frontal and likely involvement of right parietal region along the posterior aspect of the seventh fissure. He also had carotid Doppler study which showed no significant carotid artery stenosis on either side and antegrade vertebral artery flow bilaterally. NIH stroke score was 4.    LSN: Noon on 01/09/2014  tPA Given: No: Beyond time under for treatment consideration  MRankin: 2   SUBJECTIVE The patient's wife is present. The patient still has some dysarthria and left-sided weakness.   OBJECTIVE Most recent Vital Signs: Filed Vitals:   01/10/14 2238 01/11/14 0045 01/11/14 0330 01/11/14 0700  BP: 147/91 162/101 166/89 171/97  Pulse: 95 80 73 77  Temp: 99.7 F (37.6 C)  99.6 F (37.6 C)   TempSrc: Oral  Oral   Resp: 20 20 18 22   Height:      Weight:      SpO2: 94% 97% 95% 96%   CBG (last 3)   Recent Labs  01/10/14 1733 01/10/14 2237 01/11/14 0643  GLUCAP 107* 151* 134*    IV Fluid Intake:     MEDICATIONS  . aspirin  81 mg Oral Daily  . enoxaparin (LOVENOX) injection  40 mg Subcutaneous Q24H  . insulin aspart  0-9 Units Subcutaneous TID WC  . lisinopril  20 mg Oral Daily   PRN:  acetaminophen, HYDROcodone-acetaminophen  Diet:  Carb Control thin  liquids Activity:  Bedrest DVT Prophylaxis:  Lovenox  CLINICALLY SIGNIFICANT STUDIES Basic Metabolic Panel:   Recent Labs Lab 01/10/14 1149 01/11/14 0557  NA 138 137  K 3.9 4.1  CL 100 98  CO2 25 20   GLUCOSE 167* 123*  BUN 11 11  CREATININE 1.00 0.96  CALCIUM 9.3 9.0   Liver Function Tests: No results found for this basename: AST, ALT, ALKPHOS, BILITOT, PROT, ALBUMIN,  in the last 168 hours CBC:   Recent Labs Lab 01/10/14 1149 01/11/14 0557  WBC 8.8 8.4  NEUTROABS 5.1  --   HGB 15.4 16.2  HCT 45.3 46.1  MCV 85.2 84.7  PLT 293 272   Coagulation: No results found for this basename: LABPROT, INR,  in the last 168 hours Cardiac Enzymes:   Recent Labs Lab 01/10/14 1149  TROPONINI <0.30   Urinalysis: No results found for this basename: COLORURINE, APPERANCEUR, LABSPEC, PHURINE, GLUCOSEU, HGBUR, BILIRUBINUR, KETONESUR, PROTEINUR, UROBILINOGEN, NITRITE, LEUKOCYTESUR,  in the last 168 hours Lipid Panel    Component Value Date/Time   CHOL 159 01/11/2014 0557   TRIG 122 01/11/2014 0557   HDL 32* 01/11/2014 0557   CHOLHDL 5.0 01/11/2014 0557   VLDL 24 01/11/2014 0557   LDLCALC 103* 01/11/2014 0557   HgbA1C  Lab Results  Component Value Date   HGBA1C 7.5* 06/21/2013    Urine Drug Screen:     Component Value Date/Time   LABOPIA POSITIVE* 01/11/2014 0226   COCAINSCRNUR NONE DETECTED 01/11/2014 0226   LABBENZ NONE DETECTED 01/11/2014 0226   AMPHETMU NONE DETECTED 01/11/2014 0226  THCU POSITIVE* 01/11/2014 0226   LABBARB NONE DETECTED 01/11/2014 0226    Alcohol Level: No results found for this basename: ETH,  in the last 168 hours  Dg Chest 2 View 01/10/2014    No active cardiopulmonary disease.    Ct Head Wo Contrast 01/10/2014    1. Focal hypoattenuation in the posterior right frontal lobe and likely parietal lobe along the posterior aspect of the sylvian fissure is most consistent with an acute nonhemorrhagic infarct.     Mr Brain Wo Contrast 01/10/2014    Acute ischemic right MCA territory infarct as above. No intracranial hemorrhage.    MRA BRAIN:   1. No MRA evidence of high-grade flow-limiting stenosis, proximal branch occlusion, or intracranial aneurysm.  2. Attenuation of the  distal right MCA branch vessels in region of right MCA territory infarct.     US Carotid Duplex Bilateral 01/10/2014    Bilateral atherosclerotic plaque, left subjectively greater than right, not resulting in a hemodynamically significant stenosis.      2D Echocardiogram  pending  EKG sinus rhythm rate 75 beats per minute. For complete results please see formal report.   Therapy Recommendations - pending  Physical Exam   Mental Status:  Alert, oriented, thought content appropriate. Mild dysarthria without evidence of aphasia. Able to follow commands without difficulty.  Cranial Nerves:  II-Visual fields were normal.  III/IV/VI-Pupils were equal and reacted. Extraocular movements were full and conjugate.  V/VII-no facial numbness; mild left lower facial weakness.  VIII-normal.  X-mild dysarthria; symmetrical palatal movement.  Motor: Drift of left upper extremity with mild proximal and distal weakness; mild hip flexor weakness on the left; normal strength of right extremities proximally and distally; normal muscle tone throughout.  Sensory: Mild left neglect. Deep Tendon Reflexes: 2+ and symmetric.  Plantars: Mute bilaterally  Cerebellar: Normal finger-to-nose testing.  ASSESSMENT Mr. Aaron Medina is a 53 y.o. male presenting with dysarthria and left hemiparesis. TPA was not administered secondary to late presentation. MRI - acute ischemic right MCA territory infarct. Infarct felt to be thrombotic secondary to small vessel disease. On aspirin 81 mg orally every day prior to admission. Now on aspirin 81 mg orally every day for secondary stroke prevention. Patient with resultant left hemiparesis and dysarthria. Stroke work up underway.   Diabetes mellitus - hemoglobin A1c 7.5  Hypertension  Hyperlipidemia - cholesterol 159; LDL 103  Substance abuse history - entered screen positive for opiates and THCU  Ongoing tobacco history   Hospital day #  1  TREATMENT/PLAN  Increase aspirin to 325 mg daily for secondary stroke prevention.  Consider statin therapy  Risk factor modification - smoking cessation. Substance abuse cessation.  Await therapy evaluations  Await 2-D echo    Mikey Bussing PA-C [facility] Pager [phone] 01/11/2014, 7:48 AM  I have personally obtained a history, examined the patient, evaluated imaging results, and formulated the assessment and plan of care. I agree with the above.  Leotis Pain   To contact Stroke Continuity provider, please refer to [email or web address] After hours, contact General Neurology

## 2014-01-11 NOTE — Progress Notes (Signed)
[facility] PROGRESS NOTE  Aaron Medina [id number] DOB: 02-05-1961 DOA: 01/10/2014 PCP: Vic Blackbird, MD  Assessment/Plan: Acute R MCA Territory CVA  - MRI brain positive for CVA in MCA distribution, Carotid dopplers unremarkable, 2 D ECHO pending - PT/OT/SLP  - A1C, lipid panel,  - UDS pos for marijuana and opiates - cont aspirin for now -Stroke team following HTN  - poorly controlled - continue Lisinopril, maximize dose to 40mg  today DM type 2  - pending A1C  - held metformin and placed on SSI  Radiological Exams on Admission:  Ct Head Wo Contrast 01/10/2014 Focal hypoattenuation in the posterior right frontal lobe and likely parietal lobe along the posterior aspect of the sylvian fissure is most consistent with an acute nonhemorrhagic infarct.   Code Status: Full Family Communication: Pt and significant other in room (indicate person spoken with, relationship, and if by phone, the number) Disposition Plan: Pending   Consultants:  Neurology  Procedures:    Antibiotics:    HPI/Subjective: No acute events noted overnight. No complaints  Objective: Filed Vitals:   01/10/14 2238 01/11/14 0045 01/11/14 0330 01/11/14 0700  BP: 147/91 162/101 166/89 171/97  Pulse: 95 80 73 77  Temp: 99.7 F (37.6 C)  99.6 F (37.6 C)   TempSrc: Oral  Oral   Resp: 20 20 18 22   Height:      Weight:      SpO2: 94% 97% 95% 96%    Intake/Output Summary (Last 24 hours) at 01/11/14 [id number] Last data filed at 01/11/14 0200  Gross per 24 hour  Intake      0 ml  Output    300 ml  Net   -300 ml   Filed Weights   01/10/14 1102  Weight: 102.059 kg (225 lb)    Exam:   General:  Awake, in nad  Cardiovascular: regular, s1, s2  Respiratory: normal resp effort, no wheezing  Abdomen: soft, nondistended  Musculoskeletal: perfused, no clubbing   Data Reviewed: Basic Metabolic Panel:  Recent Labs Lab 01/10/14 1149 01/11/14 0557  NA 138 137  K 3.9 4.1  CL  100 98  CO2 25 20  GLUCOSE 167* 123*  BUN 11 11  CREATININE 1.00 0.96  CALCIUM 9.3 9.0   Liver Function Tests: No results found for this basename: AST, ALT, ALKPHOS, BILITOT, PROT, ALBUMIN,  in the last 168 hours No results found for this basename: LIPASE, AMYLASE,  in the last 168 hours No results found for this basename: AMMONIA,  in the last 168 hours CBC:  Recent Labs Lab 01/10/14 1149 01/11/14 0557  WBC 8.8 8.4  NEUTROABS 5.1  --   HGB 15.4 16.2  HCT 45.3 46.1  MCV 85.2 84.7  PLT 293 272   Cardiac Enzymes:  Recent Labs Lab 01/10/14 1149  TROPONINI <0.30   BNP (last 3 results) No results found for this basename: PROBNP,  in the last 8760 hours CBG:  Recent Labs Lab 01/10/14 1606 01/10/14 1733 01/10/14 2237 01/11/14 0643  GLUCAP 104* 107* 151* 134*    No results found for this or any previous visit (from the past 240 hour(s)).   Studies: Dg Chest 2 View  01/10/2014   CLINICAL DATA:  Cough  EXAM: CHEST  2 VIEW  COMPARISON:  None.  FINDINGS: The heart size and mediastinal contours are within normal limits. Both lungs are clear. The visualized skeletal structures are unremarkable.  IMPRESSION: No active cardiopulmonary disease.   Electronically Signed   By: Elta Guadeloupe  Lukens M.D.   On: 01/10/2014 19:48   Ct Head Wo Contrast  01/10/2014   CLINICAL DATA:  Left arm weakness.  EXAM: CT HEAD WITHOUT CONTRAST  TECHNIQUE: Contiguous axial images were obtained from the base of the skull through the vertex without intravenous contrast.  COMPARISON:  None.  FINDINGS: Focal hypoattenuation involves the right parietal and posterior frontal cortex extending along the posterior aspect of the sylvian fissure. There is no hemorrhage or mass lesion. Hyperdensity at the right MCA bifurcation may represent acute thrombus. No midline shift is evident. Ventricles are of normal size. No significant extra-axial fluid collection is present.  The paranasal sinuses and mastoid air cells are clear.  The osseous skull is intact.  IMPRESSION: 1. Focal hypoattenuation in the posterior right frontal lobe and likely parietal lobe along the posterior aspect of the sylvian fissure is most consistent with an acute nonhemorrhagic infarct.  These results were called by telephone at the time of interpretation on 01/10/2014 at 11:33 AM to Dr. Tanna Furry , who verbally acknowledged these results.   Electronically Signed   By: Lawrence Santiago M.D.   On: 01/10/2014 11:33   Mr Brain Wo Contrast  01/10/2014   CLINICAL DATA:  One day history of left arm weakness, unsteady gait, slurred speech. Evaluate for stroke.  EXAM: MRI HEAD WITHOUT CONTRAST  MRA HEAD WITHOUT CONTRAST  TECHNIQUE: Multiplanar, multiecho pulse sequences of the brain and surrounding structures were obtained without intravenous contrast. Angiographic images of the head were obtained using MRA technique without contrast.  COMPARISON:  Prior CT from earlier the same day.  FINDINGS: MRI HEAD FINDINGS  The CSF containing spaces are within normal limits for patient age. No focal parenchymal signal abnormality is identified. No mass lesion, midline shift, or extra-axial fluid collection. Ventricles are normal in size without evidence of hydrocephalus.  There is abnormal restricted diffusion involving the right insular cortex, right operculum, with superior extension into the right frontoparietal region, compatible with acute ischemic infarct. Additional scattered subcentimeter foci of restricted diffusion are seen within the deep white matter of the right centrum semi ovale in the right frontal lobe (series 3, image 26). Few additional foci seen more superiorly within the cortex of the right frontal and parietal lobes (series 3, image 28, 29, 30). There is corresponding hypointense signal on ADC map. Cortical swelling with sulcal effacement seen within the infarcted territory on T2/FLAIR weighted sequences. No evidence of associated hemorrhage. No left-sided cerebral  infarct or infratentorial infarcts identified.  The cervicomedullary junction is normal. Pituitary gland is within normal limits. Pituitary stalk is midline. The globes and optic nerves demonstrate a normal appearance with normal signal intensity. The  The bone marrow signal intensity is normal. Calvarium is intact. Visualized upper cervical spine is within normal limits.  Scalp soft tissues are unremarkable.  Paranasal sinuses are clear.  No mastoid effusion.  MRA HEAD FINDINGS  The visualized portions of the distal cervical internal carotid arteries are widely patent with antegrade flow. The petrous, cavernous, and supra clinoid segments are symmetric in caliber bilaterally. A1 segments, anterior communicating artery, and anterior cerebral arteries are widely patent. Middle cerebral arteries are widely patent with antegrade flow without high-grade flow-limiting stenosis or proximal branch occlusion. There is attenuation of the distal right MCA territory branches. No intracranial aneurysm within the anterior circulation.  The vertebral arteries are widely patent with antegrade flow. Minimal irregularity within the distal left vertebral artery noted without high-grade stenosis. The left vertebral artery is dominant.  The posterior inferior cerebral arteries are normal. Vertebrobasilar junction and basilar artery are widely patent with antegrade flow without evidence of basilar tip stenosis or aneurysm. Posterior cerebral arteries are normal bilaterally. The superior cerebellar arteries and anterior inferior cerebellar arteries are widely patent bilaterally. No intracranial aneurysm within the posterior circulation.  IMPRESSION: MRI BRAIN:  Acute ischemic right MCA territory infarct as above. No intracranial hemorrhage.  MRA BRAIN:  1. No MRA evidence of high-grade flow-limiting stenosis, proximal branch occlusion, or intracranial aneurysm. 2. Attenuation of the distal right MCA branch vessels in region of right MCA  territory infarct.   Electronically Signed   By: Jeannine Boga M.D.   On: 01/10/2014 21:58   US Carotid Duplex Bilateral  01/10/2014   CLINICAL DATA:  CVA, loss of balance, speech disturbance, history of hypertension and syncopal episode  EXAM: BILATERAL CAROTID DUPLEX ULTRASOUND  TECHNIQUE: Pearline Cables scale imaging, color Doppler and duplex ultrasound were performed of bilateral carotid and vertebral arteries in the neck.  COMPARISON:  CT HEAD W/O CM dated 01/10/2014  FINDINGS: Criteria: Quantification of carotid stenosis is based on velocity parameters that correlate the residual internal carotid diameter with NASCET-based stenosis levels, using the diameter of the distal internal carotid lumen as the denominator for stenosis measurement.  The following velocity measurements were obtained:  RIGHT  ICA:  73/33 cm/sec  CCA:  510/25 cm/sec  SYSTOLIC ICA/CCA RATIO:  8.52  DIASTOLIC ICA/CCA RATIO:  7.78  ECA:  114 cm/sec  LEFT  ICA:  83/33 cm/sec  CCA:  242/35 cm/sec  SYSTOLIC ICA/CCA RATIO:  3.61  DIASTOLIC ICA/CCA RATIO:  4.43  ECA:  121 cm/sec  RIGHT CAROTID ARTERY: There is a very minimal amount of eccentric hypoechoic plaque within the right carotid bulb (images 6 and 20) extending to involve the origin and proximal aspects of the right internal carotid artery (image 28), not resulting in elevated peak systolic velocities within the interrogated course of the right internal carotid artery to suggest a hemodynamically significant stenosis.  RIGHT VERTEBRAL ARTERY:  Antegrade flow  LEFT CAROTID ARTERY: There is a minimal amount of eccentric mixed echogenic plaque within the left carotid bulb (images 42 and 58), extending to involve the origin and proximal aspects of the left internal carotid artery (image 65), not resulting in elevated peak systolic velocities within the interrogated course of the left internal carotid artery to suggest a hemodynamically significant stenosis.  LEFT VERTEBRAL ARTERY:  Antegrade flow   IMPRESSION: Bilateral atherosclerotic plaque, left subjectively greater than right, not resulting in a hemodynamically significant stenosis.   Electronically Signed   By: Sandi Mariscal M.D.   On: 01/10/2014 14:23   Mr Jodene Nam Head/brain Wo Cm  01/10/2014   CLINICAL DATA:  One day history of left arm weakness, unsteady gait, slurred speech. Evaluate for stroke.  EXAM: MRI HEAD WITHOUT CONTRAST  MRA HEAD WITHOUT CONTRAST  TECHNIQUE: Multiplanar, multiecho pulse sequences of the brain and surrounding structures were obtained without intravenous contrast. Angiographic images of the head were obtained using MRA technique without contrast.  COMPARISON:  Prior CT from earlier the same day.  FINDINGS: MRI HEAD FINDINGS  The CSF containing spaces are within normal limits for patient age. No focal parenchymal signal abnormality is identified. No mass lesion, midline shift, or extra-axial fluid collection. Ventricles are normal in size without evidence of hydrocephalus.  There is abnormal restricted diffusion involving the right insular cortex, right operculum, with superior extension into the right frontoparietal region, compatible with acute ischemic  infarct. Additional scattered subcentimeter foci of restricted diffusion are seen within the deep white matter of the right centrum semi ovale in the right frontal lobe (series 3, image 26). Few additional foci seen more superiorly within the cortex of the right frontal and parietal lobes (series 3, image 28, 29, 30). There is corresponding hypointense signal on ADC map. Cortical swelling with sulcal effacement seen within the infarcted territory on T2/FLAIR weighted sequences. No evidence of associated hemorrhage. No left-sided cerebral infarct or infratentorial infarcts identified.  The cervicomedullary junction is normal. Pituitary gland is within normal limits. Pituitary stalk is midline. The globes and optic nerves demonstrate a normal appearance with normal signal intensity.  The  The bone marrow signal intensity is normal. Calvarium is intact. Visualized upper cervical spine is within normal limits.  Scalp soft tissues are unremarkable.  Paranasal sinuses are clear.  No mastoid effusion.  MRA HEAD FINDINGS  The visualized portions of the distal cervical internal carotid arteries are widely patent with antegrade flow. The petrous, cavernous, and supra clinoid segments are symmetric in caliber bilaterally. A1 segments, anterior communicating artery, and anterior cerebral arteries are widely patent. Middle cerebral arteries are widely patent with antegrade flow without high-grade flow-limiting stenosis or proximal branch occlusion. There is attenuation of the distal right MCA territory branches. No intracranial aneurysm within the anterior circulation.  The vertebral arteries are widely patent with antegrade flow. Minimal irregularity within the distal left vertebral artery noted without high-grade stenosis. The left vertebral artery is dominant. The posterior inferior cerebral arteries are normal. Vertebrobasilar junction and basilar artery are widely patent with antegrade flow without evidence of basilar tip stenosis or aneurysm. Posterior cerebral arteries are normal bilaterally. The superior cerebellar arteries and anterior inferior cerebellar arteries are widely patent bilaterally. No intracranial aneurysm within the posterior circulation.  IMPRESSION: MRI BRAIN:  Acute ischemic right MCA territory infarct as above. No intracranial hemorrhage.  MRA BRAIN:  1. No MRA evidence of high-grade flow-limiting stenosis, proximal branch occlusion, or intracranial aneurysm. 2. Attenuation of the distal right MCA branch vessels in region of right MCA territory infarct.   Electronically Signed   By: Jeannine Boga M.D.   On: 01/10/2014 21:58    Scheduled Meds: . aspirin EC  325 mg Oral Daily  . enoxaparin (LOVENOX) injection  40 mg Subcutaneous Q24H  . insulin aspart  0-9 Units  Subcutaneous TID WC  . lisinopril  20 mg Oral Daily   Continuous Infusions:   Active Problems:   Stroke  Time spent: 70min  Tiasia Weberg, [facility] Pager [phone]. If 7PM-7AM, please contact night-coverage at [email or web address], password [facility] 01/11/2014, 8:22 AM  LOS: 1 day

## 2014-01-11 NOTE — Progress Notes (Signed)
PT Cancellation Note  Patient Details Name: Aaron Medina MRN: [id number] DOB: [birth year]   Cancelled Treatment:    Reason Eval/Treat Not Completed: Patient not medically ready.  Pt currently with 2 bedrest orders.  Please advance activity orders when appropriate to allow for therapy evals.  Thanks.     Woodward Klem, Thornton Papas 01/11/2014, 7:20 AM

## 2014-01-11 NOTE — Evaluation (Signed)
Physical Therapy Evaluation Patient Details Name: Aaron Medina MRN: [id number] DOB: October 08, 1961 Today's Date: 01/11/2014   History of Present Illness  pt presents with R MCA Infarct.    Clinical Impression  Pt with decreased awareness of deficits and decreased attention to L side.  Pt's BPs 171/105 supine, 149/110 sitting, and 160/99 after ambulating.  RN made aware of BPs.  Pt and family ed about s/s CVA and not waiting as long to seek medical help.  Encouraged pt to attempt using L UE during lunch and other tasks to increase activity.  Pt seems to have good family support and would benefit from OPPT for balance and safety.  Family concerned with finding therapy services near them to avoid driving to [place].  Will continue to follow.      Follow Up Recommendations Outpatient PT;Supervision/Assistance - 24 hour    Equipment Recommendations  None recommended by PT    Recommendations for Other Services       Precautions / Restrictions Precautions Precautions: Fall Restrictions Weight Bearing Restrictions: No      Mobility  Bed Mobility Overal bed mobility: Modified Independent                Transfers Overall transfer level: Needs assistance Equipment used: None Transfers: Sit to/from Stand Sit to Stand: Supervision         General transfer comment: pt relies on R UE to A with transfers and needs cueing to attempt to use L UE.  pt tends to have uncontrolled descent to sitting.    Ambulation/Gait Ambulation/Gait assistance: Min guard Ambulation Distance (Feet): 200 Feet Assistive device: None Gait Pattern/deviations: Step-through pattern;Decreased stride length;Ataxic;Drifts right/left   Gait velocity interpretation: Below normal speed for age/gender General Gait Details: pt with decreased attention to L side and runs into objects on L and hits L hand into furniture.  pt with decreased coordination in L LE during ambulation.    Stairs             Wheelchair Mobility    Modified Rankin (Stroke Patients Only) Modified Rankin (Stroke Patients Only) Pre-Morbid Rankin Score: No symptoms Modified Rankin: Moderately severe disability     Balance Overall balance assessment: Needs assistance         Standing balance support: No upper extremity supported Standing balance-Leahy Scale: Good                               Pertinent Vitals/Pain Denied pain.      Home Living Family/patient expects to be discharged to:: Private residence Living Arrangements: Spouse/significant other Available Help at Discharge: Family;Available 24 hours/day (wife to take FMLA and other family to [place].  ) Type of Home: House Home Access: Stairs to enter Entrance Stairs-Rails: None Entrance Stairs-Number of Steps: 1 Home Layout: Laundry or work area in [employer]: None      Prior Function Level of Independence: Independent               Hand Dominance   Dominant Hand: Right    Extremity/Trunk Assessment   Upper Extremity Assessment: Defer to OT evaluation           Lower Extremity Assessment: LLE deficits/detail   LLE Deficits / Details: Good strength, but decreased coordination, ataxic.       Communication   Communication: Expressive difficulties  Cognition Arousal/Alertness: Awake/alert Behavior During Therapy: WFL for tasks assessed/performed Overall Cognitive Status: Impaired/Different from baseline  Area of Impairment: Safety/judgement;Awareness;Problem solving         Safety/Judgement: Decreased awareness of deficits Awareness: Anticipatory Problem Solving: Difficulty sequencing      General Comments      Exercises        Assessment/Plan    PT Assessment Patient needs continued PT services  PT Diagnosis Difficulty walking;Hemiplegia non-dominant side   PT Problem List Decreased activity tolerance;Decreased balance;Decreased mobility;Decreased coordination;Decreased cognition   PT Treatment Interventions DME instruction;Gait training;Stair training;Functional mobility training;Therapeutic activities;Therapeutic exercise;Balance training;Neuromuscular re-education;Cognitive remediation;Patient/family education   PT Goals (Current goals can be found in the Care Plan section) Acute Rehab PT Goals Patient Stated Goal: Home PT Goal Formulation: With patient/family Time For Goal Achievement: 01/25/14 Potential to Achieve Goals: Good    Frequency Min 4X/week   Barriers to discharge        Co-evaluation               End of Session   Activity Tolerance: Patient tolerated treatment well Patient left: in bed;with call bell/phone within reach;with family/visitor present (pt sitting EOB to eat lunch) Nurse Communication: Mobility status (BPs)         Time: 9449-6759 PT Time Calculation (min): 36 min   Charges:   PT Evaluation $Initial PT Evaluation Tier I: 1 Procedure PT Treatments $Gait Training: 23-37 mins   PT G CodesCatarina Hartshorn, Mathews 01/11/2014, 12:19 PM

## 2014-01-12 DIAGNOSIS — I517 Cardiomegaly: Secondary | ICD-10-CM

## 2014-01-12 LAB — GLUCOSE, CAPILLARY
Glucose-Capillary: 132 mg/dL — ABNORMAL HIGH (ref 70–99)
Glucose-Capillary: 115 mg/dL — ABNORMAL HIGH (ref 70–99)
Glucose-Capillary: 111 mg/dL — ABNORMAL HIGH (ref 70–99)
Glucose-Capillary: 137 mg/dL — ABNORMAL HIGH (ref 70–99)

## 2014-01-12 MED ORDER — ATORVASTATIN CALCIUM 10 MG PO TABS
10.0000 mg | ORAL_TABLET | Freq: Every day | ORAL | Status: DC
  Administered 2014-01-12 – 2014-01-14 (×3): 10 mg via ORAL
  Filled 2014-01-12 (×3): qty 1

## 2014-01-12 NOTE — Progress Notes (Signed)
  Echocardiogram 2D Echocardiogram has been performed.  Janalee Dane M 01/12/2014, 10:13 AM

## 2014-01-12 NOTE — Progress Notes (Signed)
[facility] PROGRESS NOTE  NYHEIM SEUFERT HUD:[id number] DOB: 09/20/1961 DOA: 01/10/2014 PCP: Vic Blackbird, MD  Assessment/Plan: Acute R MCA Territory CVA  - MRI brain positive for CVA in MCA distribution, Carotid dopplers unremarkable, 2 D ECHO largely unremarkable, however recommendations for limited contrast echo to r/o LV thrombus - PT/OT/SLP with recs for outpatient physical therapy - A1C of 7, lipid panel with LDL of 103 - UDS pos for marijuana and opiates - cont aspirin for now -Stroke team following HTN  - poorly controlled - continue Lisinopril, maximized dose to 40mg  DM type 2  - held metformin and placed on SSI  Radiological Exams on Admission:  Ct Head Wo Contrast 01/10/2014 Focal hypoattenuation in the posterior right frontal lobe and likely parietal lobe along the posterior aspect of the sylvian fissure is most consistent with an acute nonhemorrhagic infarct.   Code Status: Full Family Communication: Pt and significant other in room (indicate person spoken with, relationship, and if by phone, the number) Disposition Plan: Pending   Consultants:  Neurology  Procedures:    Antibiotics:    HPI/Subjective: No acute events noted overnight. No complaints. Eager to go home  Objective: Filed Vitals:   01/12/14 0139 01/12/14 0655 01/12/14 1010 01/12/14 1426  BP: 142/92 139/100 123/87 144/94  Pulse: 83 88 83 89  Temp: 98.6 F (37 C)  98.2 F (36.8 C) 98.2 F (36.8 C)  TempSrc: Oral  Oral Oral  Resp: 18 20 20 20   Height:      Weight:      SpO2: 95% 97% 100% 97%   No intake or output data in the 24 hours ending 01/12/14 1521 Filed Weights   01/10/14 1102  Weight: 102.059 kg (225 lb)    Exam:   General:  Awake, in nad  Cardiovascular: regular, s1, s2  Respiratory: normal resp effort, no wheezing  Abdomen: soft, nondistended  Musculoskeletal: perfused, no clubbing   Data Reviewed: Basic Metabolic Panel:  Recent Labs Lab  01/10/14 1149 01/11/14 0557  NA 138 137  K 3.9 4.1  CL 100 98  CO2 25 20  GLUCOSE 167* 123*  BUN 11 11  CREATININE 1.00 0.96  CALCIUM 9.3 9.0   Liver Function Tests: No results found for this basename: AST, ALT, ALKPHOS, BILITOT, PROT, ALBUMIN,  in the last 168 hours No results found for this basename: LIPASE, AMYLASE,  in the last 168 hours No results found for this basename: AMMONIA,  in the last 168 hours CBC:  Recent Labs Lab 01/10/14 1149 01/11/14 0557  WBC 8.8 8.4  NEUTROABS 5.1  --   HGB 15.4 16.2  HCT 45.3 46.1  MCV 85.2 84.7  PLT 293 272   Cardiac Enzymes:  Recent Labs Lab 01/10/14 1149  TROPONINI <0.30   BNP (last 3 results) No results found for this basename: PROBNP,  in the last 8760 hours CBG:  Recent Labs Lab 01/11/14 1140 01/11/14 1637 01/11/14 2221 01/12/14 0648 01/12/14 1141  GLUCAP 140* 114* 130* 132* 115*    No results found for this or any previous visit (from the past 240 hour(s)).   Studies: Dg Chest 2 View  01/10/2014   CLINICAL DATA:  Cough  EXAM: CHEST  2 VIEW  COMPARISON:  None.  FINDINGS: The heart size and mediastinal contours are within normal limits. Both lungs are clear. The visualized skeletal structures are unremarkable.  IMPRESSION: No active cardiopulmonary disease.   Electronically Signed   By: Inez Catalina M.D.   On:  01/10/2014 19:48   Mr Brain Wo Contrast  01/10/2014   CLINICAL DATA:  One day history of left arm weakness, unsteady gait, slurred speech. Evaluate for stroke.  EXAM: MRI HEAD WITHOUT CONTRAST  MRA HEAD WITHOUT CONTRAST  TECHNIQUE: Multiplanar, multiecho pulse sequences of the brain and surrounding structures were obtained without intravenous contrast. Angiographic images of the head were obtained using MRA technique without contrast.  COMPARISON:  Prior CT from earlier the same day.  FINDINGS: MRI HEAD FINDINGS  The CSF containing spaces are within normal limits for patient age. No focal parenchymal signal  abnormality is identified. No mass lesion, midline shift, or extra-axial fluid collection. Ventricles are normal in size without evidence of hydrocephalus.  There is abnormal restricted diffusion involving the right insular cortex, right operculum, with superior extension into the right frontoparietal region, compatible with acute ischemic infarct. Additional scattered subcentimeter foci of restricted diffusion are seen within the deep white matter of the right centrum semi ovale in the right frontal lobe (series 3, image 26). Few additional foci seen more superiorly within the cortex of the right frontal and parietal lobes (series 3, image 28, 29, 30). There is corresponding hypointense signal on ADC map. Cortical swelling with sulcal effacement seen within the infarcted territory on T2/FLAIR weighted sequences. No evidence of associated hemorrhage. No left-sided cerebral infarct or infratentorial infarcts identified.  The cervicomedullary junction is normal. Pituitary gland is within normal limits. Pituitary stalk is midline. The globes and optic nerves demonstrate a normal appearance with normal signal intensity. The  The bone marrow signal intensity is normal. Calvarium is intact. Visualized upper cervical spine is within normal limits.  Scalp soft tissues are unremarkable.  Paranasal sinuses are clear.  No mastoid effusion.  MRA HEAD FINDINGS  The visualized portions of the distal cervical internal carotid arteries are widely patent with antegrade flow. The petrous, cavernous, and supra clinoid segments are symmetric in caliber bilaterally. A1 segments, anterior communicating artery, and anterior cerebral arteries are widely patent. Middle cerebral arteries are widely patent with antegrade flow without high-grade flow-limiting stenosis or proximal branch occlusion. There is attenuation of the distal right MCA territory branches. No intracranial aneurysm within the anterior circulation.  The vertebral arteries  are widely patent with antegrade flow. Minimal irregularity within the distal left vertebral artery noted without high-grade stenosis. The left vertebral artery is dominant. The posterior inferior cerebral arteries are normal. Vertebrobasilar junction and basilar artery are widely patent with antegrade flow without evidence of basilar tip stenosis or aneurysm. Posterior cerebral arteries are normal bilaterally. The superior cerebellar arteries and anterior inferior cerebellar arteries are widely patent bilaterally. No intracranial aneurysm within the posterior circulation.  IMPRESSION: MRI BRAIN:  Acute ischemic right MCA territory infarct as above. No intracranial hemorrhage.  MRA BRAIN:  1. No MRA evidence of high-grade flow-limiting stenosis, proximal branch occlusion, or intracranial aneurysm. 2. Attenuation of the distal right MCA branch vessels in region of right MCA territory infarct.   Electronically Signed   By: Jeannine Boga M.D.   On: 01/10/2014 21:58   Mr Jodene Nam Head/brain Wo Cm  01/10/2014   CLINICAL DATA:  One day history of left arm weakness, unsteady gait, slurred speech. Evaluate for stroke.  EXAM: MRI HEAD WITHOUT CONTRAST  MRA HEAD WITHOUT CONTRAST  TECHNIQUE: Multiplanar, multiecho pulse sequences of the brain and surrounding structures were obtained without intravenous contrast. Angiographic images of the head were obtained using MRA technique without contrast.  COMPARISON:  Prior CT from earlier the  same day.  FINDINGS: MRI HEAD FINDINGS  The CSF containing spaces are within normal limits for patient age. No focal parenchymal signal abnormality is identified. No mass lesion, midline shift, or extra-axial fluid collection. Ventricles are normal in size without evidence of hydrocephalus.  There is abnormal restricted diffusion involving the right insular cortex, right operculum, with superior extension into the right frontoparietal region, compatible with acute ischemic infarct. Additional  scattered subcentimeter foci of restricted diffusion are seen within the deep white matter of the right centrum semi ovale in the right frontal lobe (series 3, image 26). Few additional foci seen more superiorly within the cortex of the right frontal and parietal lobes (series 3, image 28, 29, 30). There is corresponding hypointense signal on ADC map. Cortical swelling with sulcal effacement seen within the infarcted territory on T2/FLAIR weighted sequences. No evidence of associated hemorrhage. No left-sided cerebral infarct or infratentorial infarcts identified.  The cervicomedullary junction is normal. Pituitary gland is within normal limits. Pituitary stalk is midline. The globes and optic nerves demonstrate a normal appearance with normal signal intensity. The  The bone marrow signal intensity is normal. Calvarium is intact. Visualized upper cervical spine is within normal limits.  Scalp soft tissues are unremarkable.  Paranasal sinuses are clear.  No mastoid effusion.  MRA HEAD FINDINGS  The visualized portions of the distal cervical internal carotid arteries are widely patent with antegrade flow. The petrous, cavernous, and supra clinoid segments are symmetric in caliber bilaterally. A1 segments, anterior communicating artery, and anterior cerebral arteries are widely patent. Middle cerebral arteries are widely patent with antegrade flow without high-grade flow-limiting stenosis or proximal branch occlusion. There is attenuation of the distal right MCA territory branches. No intracranial aneurysm within the anterior circulation.  The vertebral arteries are widely patent with antegrade flow. Minimal irregularity within the distal left vertebral artery noted without high-grade stenosis. The left vertebral artery is dominant. The posterior inferior cerebral arteries are normal. Vertebrobasilar junction and basilar artery are widely patent with antegrade flow without evidence of basilar tip stenosis or aneurysm.  Posterior cerebral arteries are normal bilaterally. The superior cerebellar arteries and anterior inferior cerebellar arteries are widely patent bilaterally. No intracranial aneurysm within the posterior circulation.  IMPRESSION: MRI BRAIN:  Acute ischemic right MCA territory infarct as above. No intracranial hemorrhage.  MRA BRAIN:  1. No MRA evidence of high-grade flow-limiting stenosis, proximal branch occlusion, or intracranial aneurysm. 2. Attenuation of the distal right MCA branch vessels in region of right MCA territory infarct.   Electronically Signed   By: Jeannine Boga M.D.   On: 01/10/2014 21:58    Scheduled Meds: . aspirin EC  325 mg Oral Daily  . atorvastatin  10 mg Oral q1800  . enoxaparin (LOVENOX) injection  40 mg Subcutaneous Q24H  . insulin aspart  0-9 Units Subcutaneous TID WC  . lisinopril  40 mg Oral Daily   Continuous Infusions:   Active Problems:   Stroke  Time spent: 18min  [facility], [facility] Pager [phone]. If 7PM-7AM, please contact night-coverage at [email or web address], password [facility] 01/12/2014, 3:21 PM  LOS: 2 days

## 2014-01-12 NOTE — Progress Notes (Signed)
Occupational Therapy Treatment Patient Details Name: Aaron Medina MRN: [id number] DOB: 05/05/1961 Today's Date: 01/12/2014    History of present illness pt presents with R MCA Infarct.     OT comments  Pt seen again this pm with focus of treatment on teaching pt to scan environment systematically Lt to Rt. To identify obstacles and improve safety.  Requires mod-max cues as he fatigues.  Continued education with both pt and wife.  Pt continues to be labile.   Follow Up Recommendations  Outpatient OT;Supervision/Assistance - 24 hour    Equipment Recommendations  None recommended by OT    Recommendations for Other Services      Precautions / Restrictions Precautions Precautions: Fall Precaution Comments: Lt neglect/inattention       Mobility Bed Mobility Overal bed mobility: Modified Independent                Transfers Overall transfer level: Needs assistance Equipment used: None   Sit to Stand: Supervision Stand pivot transfers: Min guard       General transfer comment: Pt runs into door frame with Lt. UE and body when walking into the bathroom    Balance                                   ADL                                         General ADL Comments: worked with pt focusing on establishing an organized scan pattern.  Worked on locating items in snack machine scanning left to rt and establishing and anchor on the left.  Also focused on safety when ambulating and scanning to the left before entering or exiting a room to locate all potential obstacles.   Pt fatigued and required rest break.  When ambulating back to room, Lt neglect was more profound with pt skimming wall on Lt with no awareness.   Pt continues to be labile      Vision                 Additional Comments: see ADL comments   Perception     Praxis      Cognition   Behavior During Therapy:  (pt labile) Overall Cognitive Status:  Impaired/Different from baseline Area of Impairment: Safety/judgement;Problem solving;Awareness          Safety/Judgement: Decreased awareness of deficits Awareness: Anticipatory Problem Solving: Slow processing;Difficulty sequencing;Requires verbal cues;Requires tactile cues General Comments: Pt makes repetitive errors during BADLs, requiring therapit's cues/intervention to correct    Extremity/Trunk Assessment               Exercises     Shoulder Instructions       General Comments      Pertinent Vitals/ Pain       No complaint of pain   Home Living                                          Prior Functioning/Environment              Frequency Min 3X/week     Progress Toward Goals  OT Goals(current goals can now be found in the care plan  section)     ADL Goals Pt Will Perform Eating: with set-up;with supervision;sitting Pt Will Perform Grooming: with supervision;standing Pt Will Perform Upper Body Bathing: sitting;with supervision Pt Will Perform Lower Body Bathing: sit to/from stand;with min guard assist Pt Will Perform Upper Body Dressing: with min assist;sitting Pt Will Perform Lower Body Dressing: with min assist;sit to/from stand Pt Will Transfer to Toilet: with supervision;ambulating;regular height toilet Pt Will Perform Toileting - Clothing Manipulation and hygiene: with supervision;sit to/from stand Pt Will Perform Tub/Shower Transfer: with min guard assist;ambulating;shower seat  Plan Discharge plan remains appropriate    Co-evaluation                 End of Session     Activity Tolerance Patient tolerated treatment well;Patient limited by fatigue   Patient Left in bed;with call bell/phone within reach;with family/visitor present   Nurse Communication Mobility status        Time: 0762-2633 OT Time Calculation (min): 28 min  Charges: OT General Charges $OT Visit: 1 Procedure OT Treatments $Therapeutic  Activity: 23-37 mins  Emalene Welte M 01/12/2014, 6:21 PM

## 2014-01-12 NOTE — Evaluation (Signed)
Occupational Therapy Evaluation Patient Details Name: Aaron Medina MRN: [id number] DOB: 1960-11-08 Today's Date: 01/12/2014    History of Present Illness pt presents with R MCA Infarct.     Clinical Impression   Pt admitted with above. He demonstrates the below listed deficits and will benefit from continued OT to maximize safety and independence with BADLs.  Pt presents to OT with Lt. Neglect/inattention; impaired problem solving, as well as lability.  Currently, he requires mod A overall with basic self care activities and min a for functional transfers.  Have provided instruction to pt and wife re: safety with BADLs and when/how to assist/cue pt to maximize independence.  Recommend OP OT. 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 UE and body when walking into the bathroom    Balance Overall balance assessment: Needs assistance Sitting-balance support: Feet supported;No upper extremity supported Sitting balance-Leahy Scale: Good     Standing balance support: No upper extremity supported Standing balance-Leahy Scale: Good                              ADL Overall ADL's : Needs assistance/impaired Eating/Feeding: Minimal assistance;Sitting   Grooming: Wash/dry hands;Wash/dry face;Applying deodorant;Brushing hair;Oral care;Minimal  assistance;Standing Grooming Details (indicate cue type and reason): mod verbal cues due to Lt neglect/inattention and deficits with problem solving  Upper Body Bathing: Moderate assistance;Sitting   Lower Body Bathing: Moderate assistance;Sit to/from stand   Upper Body Dressing : Moderate assistance;Sitting   Lower Body Dressing: Moderate assistance;Sit to/from stand   Toilet Transfer: Minimal assistance;Ambulation;Comfort height toilet   Toileting- Clothing Manipulation and Hygiene: Minimal assistance;Sit to/from stand       Functional mobility during ADLs: Minimal assistance General ADL Comments: Pt will spontaneously attempt to incorporate Lt. UE into task, but it will drift off task and he has difficulty with control and manipulation.   He requires assist with problem solving during BADLs and cues due to neglect     Vision Eye Alignment: Within Functional Limits Alignment/Gaze Preference: Within Defined Limits Ocular Range of Motion: Within Functional Limits Tracking/Visual Pursuits: Able to track stimulus in all quads without difficulty         Additional Comments: Pt misses items presented on Lt if bil stimuli presented during confrontation testing, but was accurate with no obvious field deficit when single item presented Lt and Rt.    Perception Perception Perception Tested?: Yes Perception Deficits: Inattention/neglect Inattention/Neglect: Does not attend to left visual field;Does not attend to left side of body Spatial deficits: Will attend to Lt. but not fully, and distracts easily.     Praxis Praxis Praxis tested?: Within functional limits    Pertinent Vitals/Pain 0/10     Hand Dominance Right   Extremity/Trunk Assessment Upper Extremity Assessment Upper Extremity Assessment: LUE deficits/detail LUE  Deficits / Details: Pt with full AROM, but significant coordination difficulties due to Lt. neglect/inattention.  Pt moved to EOB and sat on Lt. UE with no  awareness.  Proprioception impaired per observation.  Pt unable to grade force of grasp LUE Sensation: decreased proprioception LUE Coordination: decreased fine motor;decreased gross motor   Lower Extremity Assessment Lower Extremity Assessment: Defer to PT evaluation   Cervical / Trunk Assessment Cervical / Trunk Assessment: Normal   Communication Communication Communication: Expressive difficulties (slurred speech)   Cognition Arousal/Alertness: Awake/alert Behavior During Therapy:  (pt labile) Overall Cognitive Status: Impaired/Different from baseline Area of Impairment: Safety/judgement;Problem solving;Awareness         Safety/Judgement: Decreased awareness of deficits Awareness: Anticipatory Problem Solving: Slow processing;Difficulty sequencing;Requires verbal cues;Requires tactile cues General Comments: Pt makes repetitive errors during BADLs, requiring therapit's cues/intervention to correct   General Comments       Exercises Exercises: Other exercises Other Exercises Other Exercises: instructed pt and wife to have pt attempt to fold towels as it is a bilateral repetitive and familiar task to facilitate increased use of Lt. UE   Shoulder Instructions      Home Living Family/patient expects to be discharged to:: Private residence Living Arrangements: Spouse/significant other Available Help at Discharge: Family;Available 24 hours/day (wife to take FMLA and other family to [place].  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    OT Diagnosis: Generalized  weakness;Cognitive deficits;Other (comment);Disturbance of vision (Lt neglect)   OT Problem List: Decreased activity tolerance;Impaired vision/perception;Decreased coordination;Decreased cognition;Decreased safety awareness;Decreased knowledge of use of DME or AE;Impaired sensation;Impaired UE functional use   OT Treatment/Interventions: Self-care/ADL training;Neuromuscular education;DME and/or AE instruction;Therapeutic activities;Cognitive remediation/compensation;Visual/perceptual remediation/compensation;Patient/family education    OT Goals(Current goals can be found in the care plan section) Acute Rehab OT Goals Patient Stated Goal: Home OT Goal Formulation: With patient/family Time For Goal Achievement: 01/19/14 Potential to Achieve Goals: Good ADL Goals Pt Will Perform Eating: with set-up;with supervision;sitting Pt Will Perform Grooming: with supervision;standing Pt Will Perform Upper Body Bathing: sitting;with supervision Pt Will Perform Lower Body Bathing: sit to/from stand;with min guard assist Pt Will Perform Upper Body Dressing: with min assist;sitting Pt Will Perform Lower Body Dressing: with min assist;sit to/from stand Pt Will Transfer to Toilet: with supervision;ambulating;regular height toilet Pt Will Perform Toileting - Clothing Manipulation and hygiene: with supervision;sit to/from stand Pt Will Perform Tub/Shower Transfer: with min guard assist;ambulating;shower seat  OT Frequency: Min 3X/week   Barriers to D/C:            Co-evaluation              End of Session Nurse Communication: Mobility status  Activity Tolerance: Patient tolerated treatment well Patient left: in bed;with call bell/phone within reach;with family/visitor present   Time: 5852-7782 OT Time Calculation (min): 98 min Charges:  OT General Charges $OT Visit: 1 Procedure OT Evaluation $Initial OT Evaluation Tier I: 1 Procedure OT Treatments $Self Care/Home Management : 68-82  mins $Neuromuscular Re-education: 8-22 mins G-Codes:    Jerline Linzy M 2014-02-04, 2:08 PM

## 2014-01-12 NOTE — Progress Notes (Signed)
Stroke Team Progress Note  HISTORY Aaron Medina is a 53 y.o. male with a history of diabetes mellitus, hypertension and hyperlipidemia who developed onset of acute left side weakness and slurred speech at noon on 01/09/2014. There was no previous history of stroke nor TIA. He was on aspirin 81 mg per day. He did not seek medical attention until 01/10/2014. He woke up and had actual worsening of his weakness on the left side. He went to [facility] emergency room at [facility] where CT scan of his head showed acute right posterior frontal and likely involvement of right parietal region along the posterior aspect of the seventh fissure. He also had Carotid Doppler study which showed no significant carotid artery stenosis on either side and antegrade vertebral artery flow bilaterally. NIH stroke score was 4.    LSN: Noon on 01/09/2014  tPA Given: No: Beyond time under for treatment consideration  MRankin: 2   SUBJECTIVE The patient's wife is at the bedside. The patient is anxious for discharge but he is also anxious to recover from a stroke. We discussed smoking cessation in order to decrease his risk for further strokes.  OBJECTIVE Most recent Vital Signs: Filed Vitals:   01/11/14 1857 01/11/14 2228 01/12/14 0139 01/12/14 0655  BP: 165/96 151/98 142/92 139/100  Pulse: 80 82 83 88  Temp: 98.3 F (36.8 C) 98.6 F (37 C) 98.6 F (37 C)   TempSrc: Oral Oral Oral   Resp: 20 18 18 20   Height:      Weight:      SpO2: 97% 98% 95% 97%   CBG (last 3)   Recent Labs  01/11/14 1637 01/11/14 2221 01/12/14 0648  GLUCAP 114* 130* 132*    IV Fluid Intake:     MEDICATIONS  . aspirin EC  325 mg Oral Daily  . enoxaparin (LOVENOX) injection  40 mg Subcutaneous Q24H  . insulin aspart  0-9 Units Subcutaneous TID WC  . lisinopril  40 mg Oral Daily   PRN:  acetaminophen, hydrALAZINE, HYDROcodone-acetaminophen  Diet:  Carb Control thin  liquids Activity:  Bedrest DVT Prophylaxis:  Lovenox  CLINICALLY  SIGNIFICANT STUDIES Basic Metabolic Panel:   Recent Labs Lab 01/10/14 1149 01/11/14 0557  NA 138 137  K 3.9 4.1  CL 100 98  CO2 25 20  GLUCOSE 167* 123*  BUN 11 11  CREATININE 1.00 0.96  CALCIUM 9.3 9.0   Liver Function Tests: No results found for this basename: AST, ALT, ALKPHOS, BILITOT, PROT, ALBUMIN,  in the last 168 hours CBC:   Recent Labs Lab 01/10/14 1149 01/11/14 0557  WBC 8.8 8.4  NEUTROABS 5.1  --   HGB 15.4 16.2  HCT 45.3 46.1  MCV 85.2 84.7  PLT 293 272   Coagulation: No results found for this basename: LABPROT, INR,  in the last 168 hours Cardiac Enzymes:   Recent Labs Lab 01/10/14 1149  TROPONINI <0.30   Urinalysis: No results found for this basename: COLORURINE, APPERANCEUR, LABSPEC, PHURINE, GLUCOSEU, HGBUR, BILIRUBINUR, KETONESUR, PROTEINUR, UROBILINOGEN, NITRITE, LEUKOCYTESUR,  in the last 168 hours Lipid Panel    Component Value Date/Time   CHOL 159 01/11/2014 0557   TRIG 122 01/11/2014 0557   HDL 32* 01/11/2014 0557   CHOLHDL 5.0 01/11/2014 0557   VLDL 24 01/11/2014 0557   LDLCALC 103* 01/11/2014 0557   HgbA1C  Lab Results  Component Value Date   HGBA1C 7.0* 01/11/2014    Urine Drug Screen:     Component Value Date/Time  LABOPIA POSITIVE* 01/11/2014 0226   COCAINSCRNUR NONE DETECTED 01/11/2014 0226   LABBENZ NONE DETECTED 01/11/2014 0226   AMPHETMU NONE DETECTED 01/11/2014 0226   THCU POSITIVE* 01/11/2014 0226   LABBARB NONE DETECTED 01/11/2014 0226    Alcohol Level: No results found for this basename: ETH,  in the last 168 hours  Dg Chest 2 View 01/10/2014    No active cardiopulmonary disease.    Ct Head Wo Contrast 01/10/2014    1. Focal hypoattenuation in the posterior right frontal lobe and likely parietal lobe along the posterior aspect of the sylvian fissure is most consistent with an acute nonhemorrhagic infarct.     Mr Brain Wo Contrast 01/10/2014    Acute ischemic right MCA territory infarct as above. No intracranial hemorrhage.    MRA  BRAIN:   1. No MRA evidence of high-grade flow-limiting stenosis, proximal branch occlusion, or intracranial aneurysm.  2. Attenuation of the distal right MCA branch vessels in region of right MCA territory infarct.     US Carotid Duplex Bilateral 01/10/2014    Bilateral atherosclerotic plaque, left subjectively greater than right, not resulting in a hemodynamically significant stenosis.      2D Echocardiogram  - pending  EKG sinus rhythm rate 75 beats per minute. For complete results please see formal report.   Therapy Recommendations - outpatient speech and physical therapies recommended.  Physical Exam   Mental Status:  Alert, oriented, thought content appropriate. Mild dysarthria without evidence of aphasia. Speaks slowly.  Able to follow commands without difficulty.  Cranial Nerves:  II-Visual fields were normal.  III/IV/VI-Pupils were equal and reacted. Extraocular movements were full and conjugate.  V/VII-no facial numbness; mild left lower facial weakness.  VIII-normal.  X-mild dysarthria; symmetrical palatal movement.  Motor: Drift of left upper extremity with mild proximal and distal weakness; mild hip flexor weakness on the left; normal strength of right extremities proximally and distally; normal muscle tone throughout.  Sensory: Mild left neglect. Deep Tendon Reflexes: 2+ and symmetric.  Plantars: Mute bilaterally  Cerebellar: Normal finger-to-nose testing.  ASSESSMENT Mr. Aaron Medina is a 53 y.o. male presenting with dysarthria and left hemiparesis. TPA was not administered secondary to late presentation. MRI - acute ischemic right MCA territory infarct. Infarct felt to be thrombotic secondary to small vessel disease. On aspirin 81 mg orally every day prior to admission. Now on aspirin 81 mg orally every day for secondary stroke prevention. Patient with resultant left hemiparesis and dysarthria. Stroke work up underway.   Diabetes mellitus - hemoglobin A1c  7.5  Hypertension  Hyperlipidemia - cholesterol 159; LDL 103  Substance abuse history - entered screen positive for opiates and THCU  Ongoing tobacco history   Hospital day # 2  TREATMENT/PLAN  Increase aspirin to 325 mg daily for secondary stroke prevention.  Will add low dose statin  Risk factor modification - smoking cessation. Substance abuse cessation.  Outpatient speech and physical therapies recommended with 24-hour per day supervision.  Await 2-D echo  Should be able to discharge soon    Lowry Ram [facility] Pager [phone] 01/12/2014, 7:55 AM  I have personally obtained a history, examined the patient, evaluated imaging results, and formulated the assessment and plan of care. I agree with the above.    To contact Stroke Continuity provider, please refer to [email or web address] After hours, contact General Neurology

## 2014-01-13 ENCOUNTER — Encounter (HOSPITAL_COMMUNITY): Payer: Self-pay | Admitting: Physician Assistant

## 2014-01-13 DIAGNOSIS — I4891 Unspecified atrial fibrillation: Secondary | ICD-10-CM

## 2014-01-13 DIAGNOSIS — I480 Paroxysmal atrial fibrillation: Secondary | ICD-10-CM | POA: Diagnosis present

## 2014-01-13 DIAGNOSIS — R9389 Abnormal findings on diagnostic imaging of other specified body structures: Secondary | ICD-10-CM

## 2014-01-13 LAB — GLUCOSE, CAPILLARY
GLUCOSE-CAPILLARY: 117 mg/dL — AB (ref 70–99)
GLUCOSE-CAPILLARY: 136 mg/dL — AB (ref 70–99)
Glucose-Capillary: 136 mg/dL — ABNORMAL HIGH (ref 70–99)
Glucose-Capillary: 141 mg/dL — ABNORMAL HIGH (ref 70–99)
Glucose-Capillary: 128 mg/dL — ABNORMAL HIGH (ref 70–99)

## 2014-01-13 LAB — SEDIMENTATION RATE: Sed Rate: 7 mm/hr (ref 0–16)

## 2014-01-13 LAB — TROPONIN I

## 2014-01-13 LAB — ANTITHROMBIN III: AntiThromb III Func: 112 % (ref 75–120)

## 2014-01-13 LAB — D-DIMER, QUANTITATIVE (NOT AT ARMC): D DIMER QUANT: 0.64 ug{FEU}/mL — AB (ref 0.00–0.48)

## 2014-01-13 MED ORDER — RIVAROXABAN 20 MG PO TABS
20.0000 mg | ORAL_TABLET | Freq: Every day | ORAL | Status: DC
  Administered 2014-01-14: 20 mg via ORAL
  Filled 2014-01-13: qty 1

## 2014-01-13 MED ORDER — NITROGLYCERIN 0.4 MG SL SUBL: 0.4000 mg | SUBLINGUAL_TABLET | SUBLINGUAL | Status: DC | PRN

## 2014-01-13 MED ORDER — ATORVASTATIN CALCIUM 10 MG PO TABS: 10.0000 mg | ORAL_TABLET | Freq: Every day | ORAL | Status: DC

## 2014-01-13 MED ORDER — CLOPIDOGREL BISULFATE 75 MG PO TABS
75.0000 mg | ORAL_TABLET | Freq: Every day | ORAL | Status: DC
  Administered 2014-01-13: 75 mg via ORAL
  Filled 2014-01-13: qty 1

## 2014-01-13 MED ORDER — METOPROLOL SUCCINATE ER 25 MG PO TB24
25.0000 mg | ORAL_TABLET | Freq: Every day | ORAL | Status: DC
Start: 2014-01-13 — End: 2014-01-14
  Administered 2014-01-13 – 2014-01-14 (×2): 25 mg via ORAL
  Filled 2014-01-13 (×2): qty 1

## 2014-01-13 MED ORDER — ASPIRIN 325 MG PO TBEC: 325.0000 mg | DELAYED_RELEASE_TABLET | Freq: Every day | ORAL | Status: DC

## 2014-01-13 NOTE — Discharge Summary (Addendum)
Physician Discharge Summary  Aaron Medina [id number] DOB: 1961/06/25 DOA: 01/10/2014  PCP: Vic Blackbird, MD  Admit date: 01/10/2014 Discharge date: 01/14/2014  Time spent: 35 minutes  Recommendations for Outpatient Follow-up:  1. Follow up with PCP in 1-2 weeks 2. Follow up with Dr. Leonie Man in 2 months 3. Follow up with Cardiology for outpatient Lexiscan stress test  Discharge Diagnoses:  Principal Problem:   Stroke Active Problems:   Type II or unspecified type diabetes mellitus without mention of complication, not stated as uncontrolled   Essential hypertension, benign   Hyperlipidemia   Tobacco use   PAF (paroxysmal atrial fibrillation), rapid ventricular response   Discharge Condition: Stable  Diet recommendation: Diabetic  Filed Weights   01/10/14 1102  Weight: 102.059 kg (225 lb)    History of present illness:  Pt is 53 yo male who presented to [facility] ED with main concern of one day duration of left arm weakness, unsteady gait, slurred speech. He explains he went to medical center [facility] and was told he had TIA, he apparently left AMA and came to [facility] today due to worsening symptoms. He denies fevers, chills, chest pain or shortness of breath, no abdominal or urinary concerns, no headaches, visual changes.  In ED, pt hemodynamically stable, vitals stable, CT head with acute stroke as noted below. TRH asked to admit to telemetry bed and transfer to [facility] requested.   Hospital Course:  Acute R MCA Territory CVA  - MRI brain positive for CVA in MCA distribution, Carotid dopplers unremarkable, 2 D ECHO largely unremarkable, however recommendations for limited contrast echo to r/o LV thrombus - see below - PT/OT/SLP with recs for outpatient physical therapy  - A1C of 7, lipid panel with LDL of 103  - UDS pos for marijuana and opiates  -Stroke team was following  - Patient has since been transitioned to [place] secondary to afib - see below HTN  - initially poorly  controlled  - continued Lisinopril, maximized dose to 40mg   - Added beta blocker per below DM type 2  - held metformin and placed on SSI  Radiological Exams on Admission:  Ct Head Wo Contrast 01/10/2014 Focal hypoattenuation in the posterior right frontal lobe and likely parietal lobe along the posterior aspect of the sylvian fissure is most consistent with an acute nonhemorrhagic infarct.  Afib w/ RVR - On the afternoon of 4/6 while ambulating w/ PT, the pt noted sudden onset diaphoresis with sob and near syncope, found to have afib RVR on tele. Cardiology was consulted with recs for scheduled beta blocker and Xarelto - A limited 2d echo with contrast was ordered to follow up on previous 2d echo which demonstrated apical hypokinesis (which was felt to be old). - Cardiology recs for outpatient Lexiscan stress test  Procedures:    Consultations:  Neurology  Cardiology  Discharge Exam: Filed Vitals:   01/14/14 0659 01/14/14 0929 01/14/14 1133 01/14/14 1423  BP: 126/72 118/82 127/88 105/72  Pulse: 72 81 87 74  Temp: 97.8 F (36.6 C) 97.9 F (36.6 C)  98 F (36.7 C)  TempSrc: Oral Oral  Oral  Resp: 18 18  18   Height:      Weight:      SpO2: 97% 99%  97%    General: awake, in nad Cardiovascular: regular, s1, s2 Respiratory: normal resp effort, no wheezing  Discharge Instructions       Future Appointments Provider Department Dept Phone   01/24/2014 12:15 PM Alycia Rossetti, MD  [place] [phone]       Medication List    STOP taking these medications       aspirin 81 MG tablet      TAKE these medications       atorvastatin 10 MG tablet  Commonly known as:  LIPITOR  Take 1 tablet (10 mg total) by mouth daily at 6 PM.     lisinopril 20 MG tablet  Commonly known as:  PRINIVIL,ZESTRIL  Take 1 tablet (20 mg total) by mouth daily.     metFORMIN 500 MG 24 hr tablet  Commonly known as:  GLUCOPHAGE-XR  Take 500 mg by mouth daily with supper.      metoprolol succinate 25 MG 24 hr tablet  Commonly known as:  TOPROL-XL  Take 1 tablet (25 mg total) by mouth daily.     multivitamin-iron-minerals-folic acid chewable tablet  Chew 1 tablet by mouth at bedtime.     Rivaroxaban 20 MG Tabs tablet  Commonly known as:  XARELTO  Take 1 tablet (20 mg total) by mouth daily with supper.     traMADol 50 MG tablet  Commonly known as:  ULTRAM  Take 1 tablet (50 mg total) by mouth every 8 (eight) hours as needed for pain.       Not on File Follow-up Information   Follow up with Vic Blackbird, MD. Schedule an appointment as soon as possible for a visit in 1 week.   Specialty:  Family Medicine   Contact information:   [address] [place] [address] [phone]       Follow up with Forbes Cellar, MD In 2 months. (Stroke Clinic)    Specialties:  Neurology, Radiology   Contact information:   [address] [place] [address] [phone]       Follow up with Vic Blackbird, MD. Schedule an appointment as soon as possible for a visit in 1 week.   Specialty:  Family Medicine   Contact information:   [address] [place] [address] [phone]        The results of significant diagnostics from this hospitalization (including imaging, microbiology, ancillary and laboratory) are listed below for reference.    Significant Diagnostic Studies: Dg Chest 2 View  01/10/2014   CLINICAL DATA:  Cough  EXAM: CHEST  2 VIEW  COMPARISON:  None.  FINDINGS: The heart size and mediastinal contours are within normal limits. Both lungs are clear. The visualized skeletal structures are unremarkable.  IMPRESSION: No active cardiopulmonary disease.   Electronically Signed   By: Inez Catalina M.D.   On: 01/10/2014 19:48   Ct Head Wo Contrast  01/10/2014   CLINICAL DATA:  Left arm weakness.  EXAM: CT HEAD WITHOUT CONTRAST  TECHNIQUE: Contiguous axial images were obtained from the base of the skull through the  vertex without intravenous contrast.  COMPARISON:  None.  FINDINGS: Focal hypoattenuation involves the right parietal and posterior frontal cortex extending along the posterior aspect of the sylvian fissure. There is no hemorrhage or mass lesion. Hyperdensity at the right MCA bifurcation may represent acute thrombus. No midline shift is evident. Ventricles are of normal size. No significant extra-axial fluid collection is present.  The paranasal sinuses and mastoid air cells are clear. The osseous skull is intact.  IMPRESSION: 1. Focal hypoattenuation in the posterior right frontal lobe and likely parietal lobe along the posterior aspect of the sylvian fissure is most consistent with an  acute nonhemorrhagic infarct.  These results were called by telephone at the time of interpretation on 01/10/2014 at 11:33 AM to Dr. Tanna Furry , who verbally acknowledged these results.   Electronically Signed   By: Lawrence Santiago M.D.   On: 01/10/2014 11:33   Mr Brain Wo Contrast  01/10/2014   CLINICAL DATA:  One day history of left arm weakness, unsteady gait, slurred speech. Evaluate for stroke.  EXAM: MRI HEAD WITHOUT CONTRAST  MRA HEAD WITHOUT CONTRAST  TECHNIQUE: Multiplanar, multiecho pulse sequences of the brain and surrounding structures were obtained without intravenous contrast. Angiographic images of the head were obtained using MRA technique without contrast.  COMPARISON:  Prior CT from earlier the same day.  FINDINGS: MRI HEAD FINDINGS  The CSF containing spaces are within normal limits for patient age. No focal parenchymal signal abnormality is identified. No mass lesion, midline shift, or extra-axial fluid collection. Ventricles are normal in size without evidence of hydrocephalus.  There is abnormal restricted diffusion involving the right insular cortex, right operculum, with superior extension into the right frontoparietal region, compatible with acute ischemic infarct. Additional scattered subcentimeter foci of  restricted diffusion are seen within the deep white matter of the right centrum semi ovale in the right frontal lobe (series 3, image 26). Few additional foci seen more superiorly within the cortex of the right frontal and parietal lobes (series 3, image 28, 29, 30). There is corresponding hypointense signal on ADC map. Cortical swelling with sulcal effacement seen within the infarcted territory on T2/FLAIR weighted sequences. No evidence of associated hemorrhage. No left-sided cerebral infarct or infratentorial infarcts identified.  The cervicomedullary junction is normal. Pituitary gland is within normal limits. Pituitary stalk is midline. The globes and optic nerves demonstrate a normal appearance with normal signal intensity. The  The bone marrow signal intensity is normal. Calvarium is intact. Visualized upper cervical spine is within normal limits.  Scalp soft tissues are unremarkable.  Paranasal sinuses are clear.  No mastoid effusion.  MRA HEAD FINDINGS  The visualized portions of the distal cervical internal carotid arteries are widely patent with antegrade flow. The petrous, cavernous, and supra clinoid segments are symmetric in caliber bilaterally. A1 segments, anterior communicating artery, and anterior cerebral arteries are widely patent. Middle cerebral arteries are widely patent with antegrade flow without high-grade flow-limiting stenosis or proximal branch occlusion. There is attenuation of the distal right MCA territory branches. No intracranial aneurysm within the anterior circulation.  The vertebral arteries are widely patent with antegrade flow. Minimal irregularity within the distal left vertebral artery noted without high-grade stenosis. The left vertebral artery is dominant. The posterior inferior cerebral arteries are normal. Vertebrobasilar junction and basilar artery are widely patent with antegrade flow without evidence of basilar tip stenosis or aneurysm. Posterior cerebral arteries are  normal bilaterally. The superior cerebellar arteries and anterior inferior cerebellar arteries are widely patent bilaterally. No intracranial aneurysm within the posterior circulation.  IMPRESSION: MRI BRAIN:  Acute ischemic right MCA territory infarct as above. No intracranial hemorrhage.  MRA BRAIN:  1. No MRA evidence of high-grade flow-limiting stenosis, proximal branch occlusion, or intracranial aneurysm. 2. Attenuation of the distal right MCA branch vessels in region of right MCA territory infarct.   Electronically Signed   By: Jeannine Boga M.D.   On: 01/10/2014 21:58   US Carotid Duplex Bilateral  01/10/2014   CLINICAL DATA:  CVA, loss of balance, speech disturbance, history of hypertension and syncopal episode  EXAM: BILATERAL CAROTID DUPLEX ULTRASOUND  TECHNIQUE: Pearline Cables scale imaging, color Doppler and duplex ultrasound were performed of bilateral carotid and vertebral arteries in the neck.  COMPARISON:  CT HEAD W/O CM dated 01/10/2014  FINDINGS: Criteria: Quantification of carotid stenosis is based on velocity parameters that correlate the residual internal carotid diameter with NASCET-based stenosis levels, using the diameter of the distal internal carotid lumen as the denominator for stenosis measurement.  The following velocity measurements were obtained:  RIGHT  ICA:  73/33 cm/sec  CCA:  0000000 cm/sec  SYSTOLIC ICA/CCA RATIO:  [id number]  DIASTOLIC ICA/CCA RATIO:  0000000  ECA:  114 cm/sec  LEFT  ICA:  83/33 cm/sec  CCA:  123456 cm/sec  SYSTOLIC ICA/CCA RATIO:  [id number]  DIASTOLIC ICA/CCA RATIO:  0000000  ECA:  121 cm/sec  RIGHT CAROTID ARTERY: There is a very minimal amount of eccentric hypoechoic plaque within the right carotid bulb (images 6 and 20) extending to involve the origin and proximal aspects of the right internal carotid artery (image 28), not resulting in elevated peak systolic velocities within the interrogated course of the right internal carotid artery to suggest a hemodynamically significant  stenosis.  RIGHT VERTEBRAL ARTERY:  Antegrade flow  LEFT CAROTID ARTERY: There is a minimal amount of eccentric mixed echogenic plaque within the left carotid bulb (images 42 and 58), extending to involve the origin and proximal aspects of the left internal carotid artery (image 65), not resulting in elevated peak systolic velocities within the interrogated course of the left internal carotid artery to suggest a hemodynamically significant stenosis.  LEFT VERTEBRAL ARTERY:  Antegrade flow  IMPRESSION: Bilateral atherosclerotic plaque, left subjectively greater than right, not resulting in a hemodynamically significant stenosis.   Electronically Signed   By: Sandi Mariscal M.D.   On: 01/10/2014 14:23   Mr Jodene Nam Head/brain Wo Cm  01/10/2014   CLINICAL DATA:  One day history of left arm weakness, unsteady gait, slurred speech. Evaluate for stroke.  EXAM: MRI HEAD WITHOUT CONTRAST  MRA HEAD WITHOUT CONTRAST  TECHNIQUE: Multiplanar, multiecho pulse sequences of the brain and surrounding structures were obtained without intravenous contrast. Angiographic images of the head were obtained using MRA technique without contrast.  COMPARISON:  Prior CT from earlier the same day.  FINDINGS: MRI HEAD FINDINGS  The CSF containing spaces are within normal limits for patient age. No focal parenchymal signal abnormality is identified. No mass lesion, midline shift, or extra-axial fluid collection. Ventricles are normal in size without evidence of hydrocephalus.  There is abnormal restricted diffusion involving the right insular cortex, right operculum, with superior extension into the right frontoparietal region, compatible with acute ischemic infarct. Additional scattered subcentimeter foci of restricted diffusion are seen within the deep white matter of the right centrum semi ovale in the right frontal lobe (series 3, image 26). Few additional foci seen more superiorly within the cortex of the right frontal and parietal lobes (series  3, image 28, 29, 30). There is corresponding hypointense signal on ADC map. Cortical swelling with sulcal effacement seen within the infarcted territory on T2/FLAIR weighted sequences. No evidence of associated hemorrhage. No left-sided cerebral infarct or infratentorial infarcts identified.  The cervicomedullary junction is normal. Pituitary gland is within normal limits. Pituitary stalk is midline. The globes and optic nerves demonstrate a normal appearance with normal signal intensity. The  The bone marrow signal intensity is normal. Calvarium is intact. Visualized upper cervical spine is within normal limits.  Scalp soft tissues are unremarkable.  Paranasal sinuses are clear.  No mastoid  effusion.  MRA HEAD FINDINGS  The visualized portions of the distal cervical internal carotid arteries are widely patent with antegrade flow. The petrous, cavernous, and supra clinoid segments are symmetric in caliber bilaterally. A1 segments, anterior communicating artery, and anterior cerebral arteries are widely patent. Middle cerebral arteries are widely patent with antegrade flow without high-grade flow-limiting stenosis or proximal branch occlusion. There is attenuation of the distal right MCA territory branches. No intracranial aneurysm within the anterior circulation.  The vertebral arteries are widely patent with antegrade flow. Minimal irregularity within the distal left vertebral artery noted without high-grade stenosis. The left vertebral artery is dominant. The posterior inferior cerebral arteries are normal. Vertebrobasilar junction and basilar artery are widely patent with antegrade flow without evidence of basilar tip stenosis or aneurysm. Posterior cerebral arteries are normal bilaterally. The superior cerebellar arteries and anterior inferior cerebellar arteries are widely patent bilaterally. No intracranial aneurysm within the posterior circulation.  IMPRESSION: MRI BRAIN:  Acute ischemic right MCA territory  infarct as above. No intracranial hemorrhage.  MRA BRAIN:  1. No MRA evidence of high-grade flow-limiting stenosis, proximal branch occlusion, or intracranial aneurysm. 2. Attenuation of the distal right MCA branch vessels in region of right MCA territory infarct.   Electronically Signed   By: Jeannine Boga M.D.   On: 01/10/2014 21:58    Microbiology: No results found for this or any previous visit (from the past 240 hour(s)).   Labs: Basic Metabolic Panel:  Recent Labs Lab 01/10/14 1149 01/11/14 0557 01/14/14 0720  NA 138 137 137  K 3.9 4.1 4.5  CL 100 98 99  CO2 25 20 20   GLUCOSE 167* 123* 131*  BUN 11 11 32*  CREATININE 1.00 0.96 1.25  CALCIUM 9.3 9.0 9.5  MG  --   --  2.4   Liver Function Tests: No results found for this basename: AST, ALT, ALKPHOS, BILITOT, PROT, ALBUMIN,  in the last 168 hours No results found for this basename: LIPASE, AMYLASE,  in the last 168 hours No results found for this basename: AMMONIA,  in the last 168 hours CBC:  Recent Labs Lab 01/10/14 1149 01/11/14 0557  WBC 8.8 8.4  NEUTROABS 5.1  --   HGB 15.4 16.2  HCT 45.3 46.1  MCV 85.2 84.7  PLT 293 272   Cardiac Enzymes:  Recent Labs Lab 01/10/14 1149 01/13/14 1722  TROPONINI <0.30 <0.30   BNP: BNP (last 3 results) No results found for this basename: PROBNP,  in the last 8760 hours CBG:  Recent Labs Lab 01/13/14 1653 01/13/14 2235 01/14/14 0643 01/14/14 1141 01/14/14 1623  GLUCAP 128* 136* 138* 130* 96   Signed:  Melea Prezioso K  [facility] 01/14/2014, 5:41 PM

## 2014-01-13 NOTE — Progress Notes (Signed)
Afternoon events noted. Appreciate input and recs from Cardiology. Discussed case with Dr. Leonie Man who agrees with starting full anticoagulation. Xarelto ordered in place of plavix. Will d/c TEE originally planned for tomorrow. Cont with limited 2D echo w/ contrast to further evaluate localized WMA seen on recent echo. Plans for outpt stress per Cardiology.

## 2014-01-13 NOTE — Progress Notes (Signed)
Occupational Therapy Treatment Patient Details Name: Aaron Medina MRN: [id number] DOB: 04/21/1961 Today's Date: 01/13/2014    History of present illness pt presents with R MCA Infarct.     OT comments  Pt demonstrates improving Lt. UE function - able to control Lt. UE during functional tasks and reaching activities better than yesterday.  Continues to require cues to attend to Lt. While ambulating or distracted.   Less lability noted today.     Follow Up Recommendations  Outpatient OT;Supervision/Assistance - 24 hour    Equipment Recommendations  None recommended by OT    Recommendations for Other Services      Precautions / Restrictions Precautions Precautions: Fall Precaution Comments: Lt neglect/inattention Restrictions Weight Bearing Restrictions: No       Mobility Bed Mobility Overal bed mobility: Modified Independent                Transfers Overall transfer level: Needs assistance Equipment used: None Transfers: Sit to/from [facility] Sit to Stand: Supervision Stand pivot transfers: Min guard       General transfer comment: cues and min guard provided to prevent pt from running into items on Lt.     Balance Overall balance assessment: Needs assistance         Standing balance support: No upper extremity supported Standing balance-Leahy Scale: Good                     ADL                       Lower Body Dressing: Moderate assistance;Sit to/from stand                 General ADL Comments: worked on Research scientist (life sciences). UE function and improving Lt. sided awareness.  Worked on reaching to [employer]. to retrieve items with Lt. UE with focus on controlled appropriate grasp (as pt tends to over recruit flexors).   Worked on trying to toss ball in Lt hand - max difficulty.  Able to drink from styrofoam cup with Lt. UE without crushing it, and able to carry styrofoam cup in Lt. hand without dropping it while  ambulating.  Mod cues to look to Lt. while ambulating.   Pt       Vision                 Additional Comments: worked on activities to increase awareness to [employer] and Lt UE   Perception     Praxis      Cognition   Behavior During Therapy: WFL for tasks assessed/performed Overall Cognitive Status: Impaired/Different from baseline Area of Impairment: Safety/judgement;Problem solving;Awareness          Safety/Judgement: Decreased awareness of deficits Awareness: Anticipatory Problem Solving: Slow processing;Difficulty sequencing;Requires verbal cues;Requires tactile cues General Comments: Pt with less lability today    Extremity/Trunk Assessment               Exercises     Shoulder Instructions       General Comments      Pertinent Vitals/ Pain       No complaint of pain   Home Living                                          Prior Functioning/Environment  Frequency Min 3X/week     Progress Toward Goals  OT Goals(current goals can now be found in the care plan section)  Progress towards OT goals: Progressing toward goals  ADL Goals Pt Will Perform Eating: with set-up;with supervision;sitting Pt Will Perform Grooming: with supervision;standing Pt Will Perform Upper Body Bathing: sitting;with supervision Pt Will Perform Lower Body Bathing: sit to/from stand;with min guard assist Pt Will Perform Upper Body Dressing: with min assist;sitting Pt Will Perform Lower Body Dressing: with min assist;sit to/from stand Pt Will Transfer to Toilet: with supervision;ambulating;regular height toilet Pt Will Perform Toileting - Clothing Manipulation and hygiene: with supervision;sit to/from stand Pt Will Perform Tub/Shower Transfer: with min guard assist;ambulating;shower seat  Plan Discharge plan remains appropriate    Co-evaluation                 End of Session     Activity Tolerance Patient tolerated treatment  well;Patient limited by fatigue   Patient Left in bed;with call bell/phone within reach;with family/visitor present   Nurse Communication Mobility status        Time: 4431-5400 OT Time Calculation (min): 62 min  Charges: OT General Charges $OT Visit: 1 Procedure OT Treatments $Therapeutic Activity: 53-67 mins  Graeson Nouri M 01/13/2014, 11:36 AM

## 2014-01-13 NOTE — Progress Notes (Signed)
Physical Therapy Treatment Patient Details Name: Aaron Medina MRN: [id number] DOB: [birth year] Today's Date: 01/13/2014    History of Present Illness pt presents with R MCA Infarct.      PT Comments    Pt mobility limited by fatigue today with pt noting multiple times feeling very fatigued.  Pt continues to be neglectful of L side, dropping cup and running into objects on L.  Will continue to follow.    Follow Up Recommendations  Outpatient PT;Supervision/Assistance - 24 hour     Equipment Recommendations  None recommended by PT    Recommendations for Other Services       Precautions / Restrictions Precautions Precautions: Fall Precaution Comments: Lt neglect/inattention Restrictions Weight Bearing Restrictions: No    Mobility  Bed Mobility Overal bed mobility: Modified Independent                Transfers Overall transfer level: Needs assistance Equipment used: None Transfers: Sit to/from Stand Sit to Stand: Supervision Stand pivot transfers: Min guard       General transfer comment: cues for attending to L UE.    Ambulation/Gait Ambulation/Gait assistance: Min guard Ambulation Distance (Feet): 120 Feet Assistive device: None Gait Pattern/deviations: Step-through pattern;Decreased stride length;Ataxic;Drifts right/left   Gait velocity interpretation: Below normal speed for age/gender General Gait Details: pt continues with decreased attention to L side and running into objects on L.  Attempted to have pt holding cup in L hand while ambulating to help cue pt to attend to L side, however pt dropped cup, breaking it after amb 40'.  pt then running L arm into wall on L side and needs max cueing throughout amb to attend to L side and safety.  Distance limited by pt c/o fatigue.     Stairs            Wheelchair Mobility    Modified Rankin (Stroke Patients Only) Modified Rankin (Stroke Patients Only) Pre-Morbid Rankin Score: No  symptoms Modified Rankin: Moderately severe disability     Balance Overall balance assessment: Needs assistance         Standing balance support: No upper extremity supported Standing balance-Leahy Scale: Good                      Cognition Arousal/Alertness: Awake/alert Behavior During Therapy: WFL for tasks assessed/performed Overall Cognitive Status: Impaired/Different from baseline Area of Impairment: Safety/judgement;Problem solving;Awareness         Safety/Judgement: Decreased awareness of deficits Awareness: Anticipatory Problem Solving: Slow processing;Difficulty sequencing;Requires verbal cues;Requires tactile cues General Comments: Pt with less lability today    Exercises      General Comments        Pertinent Vitals/Pain Denied pain.      Home Living                      Prior Function            PT Goals (current goals can now be found in the care plan section) Acute Rehab PT Goals Time For Goal Achievement: 01/25/14 Potential to Achieve Goals: Good Progress towards PT goals: Progressing toward goals    Frequency  Min 4X/week    PT Plan Current plan remains appropriate    Co-evaluation             End of Session Equipment Utilized During Treatment: Gait belt Activity Tolerance: Patient limited by fatigue Patient left: in bed;with call bell/phone within reach;with bed alarm set  Time: 0272-5366 PT Time Calculation (min): 18 min  Charges:  $Gait Training: 8-22 mins                    G CodesCatarina Hartshorn, [place] 01/13/2014, 1:41 PM

## 2014-01-13 NOTE — Consult Note (Signed)
CARDIOLOGY CONSULT NOTE   Patient ID: ROCKEY ENTWISLE MRN: [id number] DOB/AGE: 1960-10-28 53 y.o.  Admit date: 01/10/2014  Primary Physician   Vic Blackbird, MD since 03/2013, last seen 06/2013 Primary Cardiologist   New  Reason for Consultation   Abnl ECG, Abnl echo  [id number] R Beougher is a 53 y.o. male with no history of CAD. CRFs are DM, HTN, HL. He has a history of non-compliance w/ Rx. Per his wife, he would refuse his medications on a regular basis. He does not have any history of palpitations, tachycardia or any other abnormal rhythm. He never complains of chest pain.    He developed gait problems and left arm weakness on 4/2. He went to [facility], was told he had a TIA but left AMA. He went to [place] emergency room on 4/3 with worsening symptoms. A CT of the head was performed which showed an acute CVA. He was transferred to [facility] and admitted for further evaluation and treatment.  He has been followed by the internal medicine and neurology teams during his hospital stay. He has also been seen by speech therapy, occupational therapy and physical therapy. His blood pressure was as high as 179/104 but has improved with treatment. A 2-D echocardiogram was performed, results are below. He had apical hypokinesis, but his EF was preserved. There was concern for possible left ventricular apical thrombus and his ECG was abnormal so cardiology was asked to evaluate him.  Upon review of telemetry today, Mr. Kopecky had a prolonged episode of rapid atrial fibrillation. He was symptomatic with this, but not truly aware of his rapid or irregular heart rate. His heart rate was up to 150, he was ambulating at the time and developed a decreased level of consciousness. His systolic blood pressure remained greater than 100. He spontaneously converted to sinus rhythm and is maintaining sinus rhythm at this time. Mr. Pinuelas is not aware of any arrhythmia or irregular  heart rate prior to admission.   Past Medical History  Diagnosis Date  . Diabetes mellitus without complication   . Hypertension   . Hyperlipidemia   . Substance abuse     Remote  > 20 years ago, cocaine induced vasospasm     Past Surgical History  Procedure Laterality Date  . Lymph node biopsy     NKDA  I have reviewed the patient's current medications . atorvastatin  10 mg Oral q1800  . clopidogrel  75 mg Oral Q breakfast  . enoxaparin (LOVENOX) injection  40 mg Subcutaneous Q24H  . insulin aspart  0-9 Units Subcutaneous TID WC  . lisinopril  40 mg Oral Daily     acetaminophen, hydrALAZINE, HYDROcodone-acetaminophen, nitroGLYCERIN  Prior to Admission medications   Medication Sig Start Date End Date Taking? Authorizing Provider  lisinopril (PRINIVIL,ZESTRIL) 20 MG tablet Take 1 tablet (20 mg total) by mouth daily. 10/25/13  Yes Alycia Rossetti, MD  metFORMIN (GLUCOPHAGE-XR) 500 MG 24 hr tablet Take 500 mg by mouth daily with supper. 06/24/13  Yes Alycia Rossetti, MD  multivitamin-iron-minerals-folic acid (CENTRUM) chewable tablet Chew 1 tablet by mouth at bedtime.    Yes Historical Provider, MD  traMADol (ULTRAM) 50 MG tablet Take 1 tablet (50 mg total) by mouth every 8 (eight) hours as needed for pain. 06/21/13  Yes Alycia Rossetti, MD  aspirin EC 325 MG EC tablet Take 1 tablet (325 mg total) by mouth daily. 01/13/14   Donne Hazel, MD  atorvastatin (LIPITOR) 10 MG tablet Take 1 tablet (10 mg total) by mouth daily at 6 PM. 01/13/14   Donne Hazel, MD     History   Social History  . Marital Status: Married    Spouse Name: N/A    Number of Children: N/A  . Years of Education: N/A   Occupational History  . Works in shipping/recieving. Forklift driver.    Social History Main Topics  . Smoking status: Current Every Day Smoker -- 1.50 packs/day for 35 years    Types: Cigarettes  . Smokeless tobacco: Never Used  . Alcohol Use: Yes     Comment: Occasional use, not to  excess  . Drug Use: Yes     Comment: Not since the 80s  . Sexual Activity: Not on file   Other Topics Concern  . Not on file   Social History Narrative   Lives with wife.    Family Status  Relation Status Death Age  . Mother Deceased   . Father Deceased   . Sister Alive   . Brother Alive   . Daughter Alive   . Sister Alive   . Sister Alive   . Brother Deceased   . Brother Deceased   . Brother Deceased   . Brother Alive    Family History  Problem Relation Age of Onset  . Diabetes Mother   . Cancer Mother     pancreas  . Kidney disease Sister   . Diabetes Sister   . Healthy Brother   . Healthy Daughter   . Healthy Sister   . Healthy Sister   . Healthy Brother   . Healthy Brother   . Healthy Brother   . Healthy Brother      ROS: Musculoskeletal aches and pains. Full 14 point review of systems complete and found to be negative unless listed above.  Physical Exam: Blood pressure 109/80, pulse 81, temperature 97.5 F (36.4 C), temperature source Oral, resp. rate 20, height 6\' 1"  (1.854 m), weight 225 lb (102.059 kg), SpO2 100.00%.  General: Well developed, well nourished, male in no acute distress Head: Eyes PERRLA, No xanthomas.   Normocephalic and atraumatic, oropharynx without edema or exudate. Dentition: Good Lungs: CTAB Heart:  Heart regular rate and rhythm with S1, S2, no  murmur. pulses are 2+ all 4 extrem.   Neck: No carotid bruits. No lymphadenopathy. No JVD. Abdomen: Bowel sounds present, abdomen soft and non-tender without masses or hernias noted. Msk:  No spine or cva tenderness. No weakness, no joint deformities or effusions. Extremities: No clubbing or cyanosis. No edema.  Neuro: Alert and oriented X 3. Left-sided weakness.  Left facial droop. Psych:  Good affect, responds appropriately Skin: No rashes or lesions noted.  Labs:   Lab Results  Component Value Date   WBC 8.4 01/11/2014   HGB 16.2 01/11/2014   HCT 46.1 01/11/2014   MCV 84.7 01/11/2014    PLT 272 01/11/2014     Recent Labs Lab 01/11/14 0557  NA 137  K 4.1  CL 98  CO2 20  BUN 11  CREATININE 0.96  CALCIUM 9.0  GLUCOSE 123*   Lab Results  Component Value Date   CHOL 159 01/11/2014   HDL 32* 01/11/2014   LDLCALC 103* 01/11/2014   TRIG 122 01/11/2014   No results found for this basename: TSH     Cath report [facility]:  [place], [place] Name: GRECO, GASTELUM MRN: [id number] DOB: 10-Jul-1961 IP Procedure Date:  08/14/1990 Cine #: [id number] Nori Lawley underwent the following procedures: Abdominal aortogram Coronary angiogram - right Left ventriculogram Left heart catheterization Coronary angiogram - left INDICATION(S) FOR ENCOUNTER (*indicates primary reason for catheterization) * MI-recent (<= 6 wks), now asymptomatic  DIAGNOSTIC SUMMARY Coronary Artery Disease RCA system: normal Left Main: normal No significant CAD indicated LAD system: insignificant Left Ventriculogram LCX system: normal Ejection Fraction: 48%  COMMENT Proximal LAD lesion 50-90% with large dissection with possible thrombus.  ELECTRONIC COPY OF A PREVIOUSLY APPROVED REPORT.  Echo: 01/12/2014 Study Conclusions - Study data: Technically adequate study. - Left ventricle: The cavity size was normal. Wall thickness was increased in a pattern of moderate LVH. Systolic function was normal. The estimated ejection fraction was in the range of 55% to 60%. Doppler parameters are consistent with abnormal left ventricular relaxation (grade 1 diastolic dysfunction). - Regional wall motion abnormality: Hypokinesis of the apical myocardium. In setting of apical hypokinesis, cannot rule out possible LV apical thrombus. Consider contrast study to further evaluate, especially in the setting of stroke. - Aortic valve: Mildly calcified annulus. Trileaflet; normal thickness leaflets. Valve area: 3.11cm^2(VTI). Valve area: 3.28cm^2 (Vmax). - Mitral valve: Mildly calcified annulus.  Mildly thickened leaflets . Impressions: - In setting of apical hypokinesis listed indication for study is stroke, consider limited contrast echo to rule out LV apical thrombus.   ECG:  13-Jan-2014 15:50:37 - ST/T wave changes lateral leads of unclear significance. Normal sinus rhythm Possible Inferior infarct , age undetermined T wave abnormality, consider anterolateral ischemia Vent. rate 80 BPM PR interval 186 ms QRS duration 110 ms QT/QTc 384/442 ms P-R-T axes 65 33 139  Radiology:  Dg Chest 2 View 01/10/2014   CLINICAL DATA:  Cough  EXAM: CHEST  2 VIEW  COMPARISON:  None.  FINDINGS: The heart size and mediastinal contours are within normal limits. Both lungs are clear. The visualized skeletal structures are unremarkable.  IMPRESSION: No active cardiopulmonary disease.   Electronically Signed   By: Inez Catalina M.D.   On: 01/10/2014 19:48   Ct Head Wo Contrast 01/10/2014   CLINICAL DATA:  Left arm weakness.  EXAM: CT HEAD WITHOUT CONTRAST  TECHNIQUE: Contiguous axial images were obtained from the base of the skull through the vertex without intravenous contrast.  COMPARISON:  None.  FINDINGS: Focal hypoattenuation involves the right parietal and posterior frontal cortex extending along the posterior aspect of the sylvian fissure. There is no hemorrhage or mass lesion. Hyperdensity at the right MCA bifurcation may represent acute thrombus. No midline shift is evident. Ventricles are of normal size. No significant extra-axial fluid collection is present.  The paranasal sinuses and mastoid air cells are clear. The osseous skull is intact.  IMPRESSION: 1. Focal hypoattenuation in the posterior right frontal lobe and likely parietal lobe along the posterior aspect of the sylvian fissure is most consistent with an acute nonhemorrhagic infarct.  These results were called by telephone at the time of interpretation on 01/10/2014 at 11:33 AM to Dr. Tanna Furry , who verbally acknowledged these results.    Electronically Signed   By: Lawrence Santiago M.D.   On: 01/10/2014 11:33   US Carotid Duplex Bilateral 01/10/2014   CLINICAL DATA:  CVA, loss of balance, speech disturbance, history of hypertension and syncopal episode  EXAM: BILATERAL CAROTID DUPLEX ULTRASOUND  TECHNIQUE: Pearline Cables scale imaging, color Doppler and duplex ultrasound were performed of bilateral carotid and vertebral arteries in the neck.  COMPARISON:  CT HEAD W/O CM dated 01/10/2014  FINDINGS: Criteria:  Quantification of carotid stenosis is based on velocity parameters that correlate the residual internal carotid diameter with NASCET-based stenosis levels, using the diameter of the distal internal carotid lumen as the denominator for stenosis measurement.  The following velocity measurements were obtained:  RIGHT  ICA:  73/33 cm/sec  CCA:  0000000 cm/sec  SYSTOLIC ICA/CCA RATIO:  [id number]  DIASTOLIC ICA/CCA RATIO:  0000000  ECA:  114 cm/sec  LEFT  ICA:  83/33 cm/sec  CCA:  123456 cm/sec  SYSTOLIC ICA/CCA RATIO:  [id number]  DIASTOLIC ICA/CCA RATIO:  0000000  ECA:  121 cm/sec  RIGHT CAROTID ARTERY: There is a very minimal amount of eccentric hypoechoic plaque within the right carotid bulb (images 6 and 20) extending to involve the origin and proximal aspects of the right internal carotid artery (image 28), not resulting in elevated peak systolic velocities within the interrogated course of the right internal carotid artery to suggest a hemodynamically significant stenosis.  RIGHT VERTEBRAL ARTERY:  Antegrade flow  LEFT CAROTID ARTERY: There is a minimal amount of eccentric mixed echogenic plaque within the left carotid bulb (images 42 and 58), extending to involve the origin and proximal aspects of the left internal carotid artery (image 65), not resulting in elevated peak systolic velocities within the interrogated course of the left internal carotid artery to suggest a hemodynamically significant stenosis.  LEFT VERTEBRAL ARTERY:  Antegrade flow  IMPRESSION: Bilateral  atherosclerotic plaque, left subjectively greater than right, not resulting in a hemodynamically significant stenosis.   Electronically Signed   By: Sandi Mariscal M.D.   On: 01/10/2014 14:23   Mr Jodene Nam Head/brain Wo Cm 01/10/2014   CLINICAL DATA:  One day history of left arm weakness, unsteady gait, slurred speech. Evaluate for stroke.  EXAM: MRI HEAD WITHOUT CONTRAST  MRA HEAD WITHOUT CONTRAST  TECHNIQUE: Multiplanar, multiecho pulse sequences of the brain and surrounding structures were obtained without intravenous contrast. Angiographic images of the head were obtained using MRA technique without contrast.  COMPARISON:  Prior CT from earlier the same day.  FINDINGS: MRI HEAD FINDINGS  The CSF containing spaces are within normal limits for patient age. No focal parenchymal signal abnormality is identified. No mass lesion, midline shift, or extra-axial fluid collection. Ventricles are normal in size without evidence of hydrocephalus.  There is abnormal restricted diffusion involving the right insular cortex, right operculum, with superior extension into the right frontoparietal region, compatible with acute ischemic infarct. Additional scattered subcentimeter foci of restricted diffusion are seen within the deep white matter of the right centrum semi ovale in the right frontal lobe (series 3, image 26). Few additional foci seen more superiorly within the cortex of the right frontal and parietal lobes (series 3, image 28, 29, 30). There is corresponding hypointense signal on ADC map. Cortical swelling with sulcal effacement seen within the infarcted territory on T2/FLAIR weighted sequences. No evidence of associated hemorrhage. No left-sided cerebral infarct or infratentorial infarcts identified.  The cervicomedullary junction is normal. Pituitary gland is within normal limits. Pituitary stalk is midline. The globes and optic nerves demonstrate a normal appearance with normal signal intensity. The  The bone marrow  signal intensity is normal. Calvarium is intact. Visualized upper cervical spine is within normal limits.  Scalp soft tissues are unremarkable.  Paranasal sinuses are clear.  No mastoid effusion.  MRA HEAD FINDINGS  The visualized portions of the distal cervical internal carotid arteries are widely patent with antegrade flow. The petrous, cavernous, and supra clinoid segments are symmetric in caliber  bilaterally. A1 segments, anterior communicating artery, and anterior cerebral arteries are widely patent. Middle cerebral arteries are widely patent with antegrade flow without high-grade flow-limiting stenosis or proximal branch occlusion. There is attenuation of the distal right MCA territory branches. No intracranial aneurysm within the anterior circulation.  The vertebral arteries are widely patent with antegrade flow. Minimal irregularity within the distal left vertebral artery noted without high-grade stenosis. The left vertebral artery is dominant. The posterior inferior cerebral arteries are normal. Vertebrobasilar junction and basilar artery are widely patent with antegrade flow without evidence of basilar tip stenosis or aneurysm. Posterior cerebral arteries are normal bilaterally. The superior cerebellar arteries and anterior inferior cerebellar arteries are widely patent bilaterally. No intracranial aneurysm within the posterior circulation.  IMPRESSION: MRI BRAIN:  Acute ischemic right MCA territory infarct as above. No intracranial hemorrhage.  MRA BRAIN:  1. No MRA evidence of high-grade flow-limiting stenosis, proximal branch occlusion, or intracranial aneurysm. 2. Attenuation of the distal right MCA branch vessels in region of right MCA territory infarct.   Electronically Signed   By: Jeannine Boga M.D.   On: 01/10/2014 21:58    ASSESSMENT AND PLAN:   The patient was seen today by Dr. Aundra Dubin, the patient evaluated and the data reviewed.  Principal Problem:   Stroke - management per  IM/neuro.  Active Problems:   Type II or unspecified type diabetes mellitus without mention of complication, not stated as uncontrolled- per IM    Essential hypertension, benign - feel blood pressure will tolerate the addition of a beta blocker    Hyperlipidemia - on a statin    Tobacco use - cessation advised    Paroxysmal atrial fibrillation, rapid ventricular response - see below, will ck TSH    Anticoagulation: Currently on aspirin and Plavix, would discontinue Plavix and add Xarelto when OK w/ neuro    CAD - see cath report above. Had a significant LAD lesion, not able to pull records regarding treatment of this. Thallium study performed after this, results not available. Continue  ASA, beta blocker and statin.  SignedRosaria Ferries, PA-C 01/13/2014 5:02 PM Beeper [phone]  Co-Sign MD  Patient seen with PA, agree with the above note.   1. CVA: Patient was noted today to have a run of atrial fibrillation with RVR while walking with PT.  HR up to the 160s.  His CVA certainly may be related to the atrial fibrillation.  I think that he warrants anticoagulation.  - Would replace Plavix with Xarelto 20 mg daily when neurology is comfortable with the patient being on full dose anticoagulation.   - Continue statin.  - As anticoagulation is indicated, I do not think that he needs a TEE. 2. CAD: Patient says that he had an MI in the '80s that sounds like it was cocaine-related vasospasm.  He had a cardiac cath at [facility], no intervention.  His echo showed apical hypokinesis and his ECG has shown lateral/anterolateral T wave inversions (no ECG prior to admission to compare).  He has had no chest pain.  It is possible that the wall motion abnormality noted on echo is old.   - I would repeat a limited echo with contrast to better assess his wall motion (since we are not going to do a TEE).  - I do not think that cardiac catheterization is warranted at this point given the lack of symptoms of  cardiac ischemia.  I would, however, set him up for a Lexiscan Cardiolite as an outpatiet.  3. Atrial fibrillation: Patient had a run of atrial fibrillation with RVR to as high as 160 bpm this afternoon while walking with PT.  As above, his CVA may be atrial fibrillation-related.  He has RFs for atrial fibrillation: HTN, diabetes.   - As above, stop Plavix and start Xarelto 20 mg daily when neurology deems him safe for full anticoagulation.  Would have care manager look into his insurance coverage for Xarelto.  - Start Toprol XL 25 mg daily for rate control when he is in atrial fibrillation.  If he has frequent symptomatic recurrences, would use dronedarone to try to maintain NSR.  He would not be a Ic candidate given concern for prior MI.  4. Smoking: Advised patient to stop.   Loralie Champagne, MD 01/13/2014 4:45 PM  We were able to get records from [facility] in 11/91 of cath findings: Proximal LAD lesion 50-90% with large dissection with possible thrombus.  Apparently no intervention, per patient they thought it was cocaine-related.  I suspect this may explain echo and ECG findings.  Would proceed with outpatient cardiolite.   Loralie Champagne 01/13/2014 5:40 PM

## 2014-01-13 NOTE — Progress Notes (Signed)
SLP Cancellation Note  Patient Details Name: EVERARD INTERRANTE MRN: [id number] DOB: 1960-10-16   Cancelled treatment:       Reason Eval/Treat Not Completed:  (pt with PT)  SLP will continue to attempt to see for dysarthria tx.     Luanna Salk, [place] [facility] SLP [phone]

## 2014-01-13 NOTE — Progress Notes (Signed)
Received call from [place] at 1530 that patient was ambulating in hallway and suddenly became weak, nauseous, diaphoretic, and stated he didn't feel well.  This RN went to assess patient and he was sitting in chair, arousable, less responsive to commands, BP 100/80, HR rhythm went into a.fib for about 3 minutes with rate 100-150s, pulse ox would not read as patient too diaphoretic. Patient was transported back to room via chair, MD was paged, transferred patient to bed. Obtained stat EKG per MD order, lab to draw troponin and d-dimer. Patient became more responsive once back to bed, following commands, neuro back to baseline after about 15 minutes post event. Back in NSR with rate in 80s. Vitals rechecked, BP 123/72, HR 78, 97% RA, RR 20. CBG checked 141.  Dr Wyline Copas assessed patient at bedside, updated wife, patient to remain on bedrest for now and cardiology to see today. Patient stating he is starting to feel better, denies chest pain, will continue to monitor.

## 2014-01-13 NOTE — Progress Notes (Signed)
Occupational Therapy Treatment Patient Details Name: Aaron Medina MRN: [id number] DOB: [birth year] Today's Date: 01/13/2014    History of present illness pt presents with R MCA Infarct.     OT comments  Continued to work on organized scanning and improving attention to Lt.  Pt with complaint of fatigue then became diaphoretic with decreased responsiveness once seated.  HR 140-150; BP 100/80.  RN and MD notified.   Follow Up Recommendations  Outpatient OT;Supervision/Assistance - 24 hour    Equipment Recommendations  None recommended by OT    Recommendations for Other Services      Precautions / Restrictions Precautions Precautions: Fall Precaution Comments: Lt neglect/inattention Restrictions Weight Bearing Restrictions: No       Mobility Bed Mobility Overal bed mobility: Modified Independent                Transfers Overall transfer level: Needs assistance Equipment used: None Transfers: Sit to/from [employer] Transfers Sit to Stand: Supervision Stand pivot transfers: Min guard       General transfer comment: cues for attending to L UE.      Balance Overall balance assessment: Needs assistance         Standing balance support: No upper extremity supported Standing balance-Leahy Scale: Good                     ADL                                         General ADL Comments: worked with pt on organized scanning in the hallway with emphasis on Lt to Rt.  Pt requires mod cues to scan efficiently.  Pt required mod verbal cues to problem solve directions of room numbers using signs.  Pt with complaint of fatigued and noted to be perspiring on forehead.  Began ambulating back to room, with pt requesting to sit due to feeling badly.   Upon sitting pt became diaphoretic with HR 140-150 on monitor.  CNA assisted with vitals.  BP 101/80 and unable to obtain accurate 02 sat reading.  RN notified.  Pt became minimally responsive,  Assisted to recliner with staff, then transported back to room and into bed.  MD assesed pt once pt back in room.        Vision                 Additional Comments: worked on organized Sales executive   Behavior During Therapy: WFL for tasks assessed/performed Overall Cognitive Status: Impaired/Different from baseline Area of Impairment: Safety/judgement;Problem solving;Awareness          Safety/Judgement: Decreased awareness of deficits Awareness: Anticipatory Problem Solving: Slow processing;Difficulty sequencing;Requires verbal cues;Requires tactile cues      Extremity/Trunk Assessment               Exercises     Shoulder Instructions       General Comments      Pertinent Vitals/ Pain       No complaint of pain.   Home Living                                          Prior Functioning/Environment  Frequency Min 3X/week     Progress Toward Goals  OT Goals(current goals can now be found in the care plan section)  Progress towards OT goals: Not progressing toward goals - comment (due to medical issues)  ADL Goals Pt Will Perform Eating: with set-up;with supervision;sitting Pt Will Perform Grooming: with supervision;standing Pt Will Perform Upper Body Bathing: sitting;with supervision Pt Will Perform Lower Body Bathing: sit to/from stand;with min guard assist Pt Will Perform Upper Body Dressing: with min assist;sitting Pt Will Perform Lower Body Dressing: with min assist;sit to/from stand Pt Will Transfer to Toilet: with supervision;ambulating;regular height toilet Pt Will Perform Toileting - Clothing Manipulation and hygiene: with supervision;sit to/from stand Pt Will Perform Tub/Shower Transfer: with min guard assist;ambulating;shower seat  Plan Discharge plan remains appropriate    Co-evaluation                 End of Session     Activity Tolerance Treatment  limited secondary to medical complications (Comment)   Patient Left in bed;with nursing/sitter in room   Nurse Communication Other (comment) (medical issues)        Time: 1520 (5465)-6812 OT Time Calculation (min): 29 min  Charges: OT General Charges $OT Visit: 1 Procedure OT Treatments $Therapeutic Activity: 23-37 mins  Antonya Leeder M 01/13/2014, 4:53 PM

## 2014-01-13 NOTE — Progress Notes (Signed)
UR complete.  Dorin Stooksbury RN, MSN 

## 2014-01-13 NOTE — Progress Notes (Signed)
Stroke Team Progress Note  HISTORY Aaron Medina is a 53 y.o. male with a history of diabetes mellitus, hypertension and hyperlipidemia who developed onset of acute left side weakness and slurred speech at noon on 01/09/2014. There was no previous history of stroke nor TIA. He was on aspirin 81 mg per day. He went to [facility] who referred him to [facility] and we was sent home. On 01/10/2014 he woke up and had actual worsening of his weakness on the left side. He went to the emergency room at [facility] where CT scan of his head showed acute right posterior frontal and likely involvement of right parietal region along the posterior aspect of the seventh fissure. He also had Carotid Doppler study which showed no significant carotid artery stenosis on either side and antegrade vertebral artery flow bilaterally. NIH stroke score was 4. He was not a tPA candidate secondary delay in arrival. He was transferred to [facility] for further stroke evaluation and treatment.   SUBJECTIVE Wife at bedside. Patient sitting up in the chair.  OBJECTIVE Most recent Vital Signs: Filed Vitals:   01/12/14 2229 01/13/14 0157 01/13/14 0509 01/13/14 0903  BP: 129/79 129/84 130/81 132/85  Pulse: 94 87 87 85  Temp: 98.2 F (36.8 C) 98.1 F (36.7 C) 98.5 F (36.9 C) 99.3 F (37.4 C)  TempSrc: Oral Oral Oral Oral  Resp: '18 18 18 20  ' Height:      Weight:      SpO2: 97% 98% 98% 96%   CBG (last 3)   Recent Labs  01/12/14 1703 01/12/14 2222 01/13/14 0656  GLUCAP 111* 137* 136*    IV Fluid Intake:     MEDICATIONS  . aspirin EC  325 mg Oral Daily  . atorvastatin  10 mg Oral q1800  . enoxaparin (LOVENOX) injection  40 mg Subcutaneous Q24H  . insulin aspart  0-9 Units Subcutaneous TID WC  . lisinopril  40 mg Oral Daily   PRN:  acetaminophen, hydrALAZINE, HYDROcodone-acetaminophen  Diet:  Carb Control thin  liquids Activity:  Up with assistance DVT Prophylaxis:  Lovenox  CLINICALLY SIGNIFICANT  STUDIES Basic Metabolic Panel:   Recent Labs Lab 01/10/14 1149 01/11/14 0557  NA 138 137  K 3.9 4.1  CL 100 98  CO2 25 20  GLUCOSE 167* 123*  BUN 11 11  CREATININE 1.00 0.96  CALCIUM 9.3 9.0   Liver Function Tests: No results found for this basename: AST, ALT, ALKPHOS, BILITOT, PROT, ALBUMIN,  in the last 168 hours CBC:   Recent Labs Lab 01/10/14 1149 01/11/14 0557  WBC 8.8 8.4  NEUTROABS 5.1  --   HGB 15.4 16.2  HCT 45.3 46.1  MCV 85.2 84.7  PLT 293 272   Coagulation: No results found for this basename: LABPROT, INR,  in the last 168 hours Cardiac Enzymes:   Recent Labs Lab 01/10/14 1149  TROPONINI <0.30   Urinalysis: No results found for this basename: COLORURINE, APPERANCEUR, LABSPEC, PHURINE, GLUCOSEU, HGBUR, BILIRUBINUR, KETONESUR, PROTEINUR, UROBILINOGEN, NITRITE, LEUKOCYTESUR,  in the last 168 hours Lipid Panel    Component Value Date/Time   CHOL 159 01/11/2014 0557   TRIG 122 01/11/2014 0557   HDL 32* 01/11/2014 0557   CHOLHDL 5.0 01/11/2014 0557   VLDL 24 01/11/2014 0557   LDLCALC 103* 01/11/2014 0557   HgbA1C  Lab Results  Component Value Date   HGBA1C 7.0* 01/11/2014    Urine Drug Screen:     Component Value Date/Time   LABOPIA  POSITIVE* 01/11/2014 0226   COCAINSCRNUR NONE DETECTED 01/11/2014 0226   LABBENZ NONE DETECTED 01/11/2014 0226   AMPHETMU NONE DETECTED 01/11/2014 0226   THCU POSITIVE* 01/11/2014 0226   LABBARB NONE DETECTED 01/11/2014 0226    Alcohol Level: No results found for this basename: ETH,  in the last 168 hours  Dg Chest 2 View 01/10/2014    No active cardiopulmonary disease.    Ct Head Wo Contrast 01/10/2014    1. Focal hypoattenuation in the posterior right frontal lobe and likely parietal lobe along the posterior aspect of the sylvian fissure is most consistent with an acute nonhemorrhagic infarct.    Mr Brain Wo Contrast 01/10/2014    Acute ischemic right MCA territory infarct as above. No intracranial hemorrhage.    MRA BRAIN:   1. No  MRA evidence of high-grade flow-limiting stenosis, proximal branch occlusion, or intracranial aneurysm.  2. Attenuation of the distal right MCA branch vessels in region of right MCA territory infarct.     US Carotid Duplex Bilateral 01/10/2014    Bilateral atherosclerotic plaque, left subjectively greater than right, not resulting in a hemodynamically significant stenosis.     2D Echocardiogram  EF 550-60% in setting of apical hypokinesis listed indication for study asstroke, consider limited contrast echo to rule out LV apical thrombus.   TEE  EKG sinus rhythm rate 75 beats per minute. For complete results please see formal report.   Therapy Recommendations - outpatient speech and physical therapies   Physical Exam   Mental Status:  Alert, oriented, thought content appropriate. Mild dysarthria without evidence of aphasia. Speaks slowly.  Able to follow commands without difficulty.  Cranial Nerves:  II-Visual fields show partial left hemianopsia  III/IV/VI-Pupils were equal and reacted. Extraocular movements were full and conjugate.  V/VII-no facial numbness; mild left lower facial weakness.  VIII-normal.  X-mild dysarthria; symmetrical palatal movement.  Motor: Drift of left upper extremity with mild proximal and distal weakness; mild hip flexor weakness on the left; normal strength of right extremities proximally and distally; normal muscle tone throughout.  Sensory: Mild left neglect.and diminished touch/pin prick sensation Deep Tendon Reflexes: 2+ and symmetric.  Plantars: Mute bilaterally  Cerebellar: Normal finger-to-nose testing.  ASSESSMENT Aaron Medina is a 53 y.o. male presenting with dysarthria and left hemiparesis. MRI - acute ischemic right MCA territory infarct. Infarct felt to be embolic secondary to unknown source. On aspirin 81 mg orally every day prior to admission. Now on aspirin 325 mg orally every day for secondary stroke prevention. Patient with resultant  left hemiparesis, dysarthria, neurologic neglect.  Stroke work up underway.   Diabetes mellitus, uncontrolled - hemoglobin A1c 7.5, goal < 7.0  Hypertension  Hyperlipidemia - cholesterol 159; LDL 103, added lipitor 10 mg daily  Substance abuse  -  positive for opiates and THCU  Ongoing tobacco use  Hospital day # 3  TREATMENT/PLAN  Change to Plavix 75 mg daily for secondary stroke prevention. TEE to look for embolic source. Arranged with [place] for tomorrow.  If positive for PFO (patent foramen ovale), check bilateral lower extremity venous dopplers to rule out DVT as possible source of stroke. (I have made patient NPO after midnight tonight). If TEE negative, a [place] electrophysiologist will consult and consider placement of an place implantable loop recorder to evaluate for atrial fibrillation as etiology of stroke. This has been explained to patient/family by Dr. Leonie Man and they are agreeable. Given his young age,  will check hypercoagulable panel (minus factor 5 leiden and beta-2-glycoprotein as these test for venous clots) and vasculitic labs (C3, C4, CH50, ANA, ESR) HIV and RPR as possible stroke sources.  Risk factor modification - smoking cessation, substance abuse cessation, diabetes, lipids  Outpatient speech and physical therapies recommended with 24-hour per day supervision.  Dr. Leonie Man discussed diagnosis, prognosis,  treatment options and plan of care with patient, wife and Dr. Wyline Copas.  Burnetta Sabin, MSN, RN, ANVP-BC, ANP-BC, [facility] Pager: [phone] 01/13/2014 10:39 AM  I have personally obtained a history, examined the patient, evaluated imaging results, and formulated the assessment and plan of care. I agree with the above. Antony Contras, MD  To contact Stroke Continuity provider, please refer to [email or web address] After hours, contact General Neurology

## 2014-01-13 NOTE — Progress Notes (Signed)
[facility] PROGRESS NOTE  Aaron Medina PIR:[id number] DOB: 1961/06/22 DOA: 01/10/2014 PCP: Vic Blackbird, MD  Assessment/Plan: Acute R MCA Territory CVA  - MRI brain positive for CVA in MCA distribution, Carotid dopplers unremarkable, 2 D ECHO largely unremarkable, however recommendations for limited contrast echo to r/o LV thrombus - TEE tomorrow per Neurology recs - PT/OT/SLP with recs for outpatient physical therapy - A1C of 7, lipid panel with LDL of 103 - UDS pos for marijuana and opiates - cont aspirin for now -Stroke team following HTN  - poorly controlled - continue Lisinopril, maximized dose to 40mg  DM type 2  - held metformin and placed on SSI  Near Syncope -While participating in PT, pt noted sudden diaphoresis, nausea and palpitations/tachycardia to the 150's -Improved spontaneously with rest -STAT cardiac enzymes, d-dimer, and EKG done -EKG with T wave inversions noted -Recent 2D echo revealing localized area of hypokinesis in setting of prior CAD s/p stenting -Have consulted Cardiology. Appreciate recs. Radiological Exams on Admission:  Ct Head Wo Contrast 01/10/2014 Focal hypoattenuation in the posterior right frontal lobe and likely parietal lobe along the posterior aspect of the sylvian fissure is most consistent with an acute nonhemorrhagic infarct.   Code Status: Full Family Communication: Pt and significant other in room (indicate person spoken with, relationship, and if by phone, the number) Disposition Plan: Pending   Consultants:  Neurology  Cardiology  Procedures:    Antibiotics:    HPI/Subjective: While ambulating w/ PT this afternoon, pt had near syncopal episode associated with diaphoresis and nausea with tachycardia, improved with rest. Denies chest pain or SOB.  Objective: Filed Vitals:   01/12/14 2229 01/13/14 0157 01/13/14 0509 01/13/14 0903  BP: 129/79 129/84 130/81 132/85  Pulse: 94 87 87 85  Temp: 98.2 F (36.8 C)  98.1 F (36.7 C) 98.5 F (36.9 C) 99.3 F (37.4 C)  TempSrc: Oral Oral Oral Oral  Resp: 18 18 18 20   Height:      Weight:      SpO2: 97% 98% 98% 96%    Intake/Output Summary (Last 24 hours) at 01/13/14 1149 Last data filed at 01/13/14 0906  Gross per 24 hour  Intake     60 ml  Output      0 ml  Net     60 ml   Filed Weights   01/10/14 1102  Weight: 102.059 kg (225 lb)    Exam:   General:  Awake, in nad  Cardiovascular: regular, s1, s2  Respiratory: normal resp effort, no wheezing  Abdomen: soft, nondistended  Musculoskeletal: perfused, no clubbing   Data Reviewed: Basic Metabolic Panel:  Recent Labs Lab 01/10/14 1149 01/11/14 0557  NA 138 137  K 3.9 4.1  CL 100 98  CO2 25 20  GLUCOSE 167* 123*  BUN 11 11  CREATININE 1.00 0.96  CALCIUM 9.3 9.0   Liver Function Tests: No results found for this basename: AST, ALT, ALKPHOS, BILITOT, PROT, ALBUMIN,  in the last 168 hours No results found for this basename: LIPASE, AMYLASE,  in the last 168 hours No results found for this basename: AMMONIA,  in the last 168 hours CBC:  Recent Labs Lab 01/10/14 1149 01/11/14 0557  WBC 8.8 8.4  NEUTROABS 5.1  --   HGB 15.4 16.2  HCT 45.3 46.1  MCV 85.2 84.7  PLT 293 272   Cardiac Enzymes:  Recent Labs Lab 01/10/14 1149  TROPONINI <0.30   BNP (last 3 results) No results found for this  basename: PROBNP,  in the last 8760 hours CBG:  Recent Labs Lab 01/12/14 1141 01/12/14 1703 01/12/14 2222 01/13/14 0656 01/13/14 1120  GLUCAP 115* 111* 137* 136* 117*    No results found for this or any previous visit (from the past 240 hour(s)).   Studies: No results found.  Scheduled Meds: . atorvastatin  10 mg Oral q1800  . clopidogrel  75 mg Oral Q breakfast  . enoxaparin (LOVENOX) injection  40 mg Subcutaneous Q24H  . insulin aspart  0-9 Units Subcutaneous TID WC  . lisinopril  40 mg Oral Daily   Continuous Infusions:   Active Problems:   Stroke  Time  spent: 4min  Aaron Medina, [facility] Pager [phone]. If 7PM-7AM, please contact night-coverage at [email or web address], password [email or web address] 01/13/2014, 11:49 AM  LOS: 3 days

## 2014-01-13 NOTE — Progress Notes (Signed)
Occupational Therapy Note  OT was ambulating with pt in hallway when pt complained of feeling fatigued.  Noted pt diaphoretic, then stated he didn't feel well.  Moved pt to chair.  Vitals taken.  HR on monitor 140-150, unable to get accurate 02 sat reading; BP   .   CNA present, RN notified, who notified MD.  Pt became sluggish with delayed responses, minimally responsive.  With staff assist, pt moved to recliner and wheeled back to room 100/80, then transferred to bed.  RN and MD with pt.  Lucille Passy, OTR/L [phone]

## 2014-01-14 ENCOUNTER — Encounter (HOSPITAL_COMMUNITY)
Admission: EM | Disposition: A | Discharge status: Home / Self Care | Payer: Self-pay | Source: Home / Self Care | Attending: Internal Medicine

## 2014-01-14 DIAGNOSIS — I6789 Other cerebrovascular disease: Secondary | ICD-10-CM

## 2014-01-14 LAB — BASIC METABOLIC PANEL
BUN: 32 mg/dL — AB (ref 6–23)
CO2: 20 mEq/L (ref 19–32)
Calcium: 9.5 mg/dL (ref 8.4–10.5)
Chloride: 99 mEq/L (ref 96–112)
Creatinine, Ser: 1.25 mg/dL (ref 0.50–1.35)
GFR, EST AFRICAN AMERICAN: 74 mL/min — AB (ref >90)
GFR, EST NON AFRICAN AMERICAN: 64 mL/min — AB (ref >90)
Glucose, Bld: 131 mg/dL — ABNORMAL HIGH (ref 70–99)
POTASSIUM: 4.5 meq/L (ref 3.7–5.3)
SODIUM: 137 meq/L (ref 137–147)

## 2014-01-14 LAB — CARDIOLIPIN ANTIBODIES, IGG, IGM, IGA
ANTICARDIOLIPIN IGG: 2 GPL U/mL — AB (ref <23)
ANTICARDIOLIPIN IGM: 0 [MPL'U]/mL — AB (ref <11)
Anticardiolipin IgA: 4 APL U/mL — ABNORMAL LOW (ref <22)

## 2014-01-14 LAB — MAGNESIUM: Magnesium: 2.4 mg/dL (ref 1.5–2.5)

## 2014-01-14 LAB — LUPUS ANTICOAGULANT PANEL
DRVVT: 27.8 secs (ref <42.9)
Lupus Anticoagulant: NOT DETECTED
PTT Lupus Anticoagulant: 27.6 secs — ABNORMAL LOW (ref 28.0–43.0)

## 2014-01-14 LAB — GLUCOSE, CAPILLARY
Glucose-Capillary: 138 mg/dL — ABNORMAL HIGH (ref 70–99)
Glucose-Capillary: 130 mg/dL — ABNORMAL HIGH (ref 70–99)
Glucose-Capillary: 96 mg/dL (ref 70–99)

## 2014-01-14 LAB — PROTEIN C ACTIVITY: Protein C Activity: 118 % (ref 75–133)

## 2014-01-14 LAB — ANA: ANA: NEGATIVE

## 2014-01-14 LAB — RPR: RPR Ser Ql: NONREACTIVE

## 2014-01-14 LAB — HOMOCYSTEINE: Homocysteine: 10.4 umol/L (ref 4.0–15.4)

## 2014-01-14 LAB — PROTEIN S ACTIVITY: Protein S Activity: 89 % (ref 69–129)

## 2014-01-14 LAB — C4 COMPLEMENT: Complement C4, Body Fluid: 42 mg/dL — ABNORMAL HIGH (ref 10–40)

## 2014-01-14 LAB — C3 COMPLEMENT: C3 COMPLEMENT: 181 mg/dL — AB (ref 90–180)

## 2014-01-14 SURGERY — ECHOCARDIOGRAM, TRANSESOPHAGEAL
Anesthesia: Moderate Sedation

## 2014-01-14 MED ORDER — RIVAROXABAN 20 MG PO TABS: 20.0000 mg | ORAL_TABLET | Freq: Every day | ORAL | Status: DC

## 2014-01-14 MED ORDER — PERFLUTREN LIPID MICROSPHERE
1.0000 mL | INTRAVENOUS | Status: AC | PRN
  Administered 2014-01-14: 1 mL via INTRAVENOUS
  Filled 2014-01-14: qty 10

## 2014-01-14 MED ORDER — METOPROLOL SUCCINATE ER 25 MG PO TB24: 25.0000 mg | ORAL_TABLET | Freq: Every day | ORAL | Status: DC

## 2014-01-14 MED ORDER — PERFLUTREN LIPID MICROSPHERE
INTRAVENOUS | Status: AC
  Filled 2014-01-14: qty 10

## 2014-01-14 NOTE — Progress Notes (Signed)
Occupational Therapy Treatment Patient Details Name: TOLLIE CANADA MRN: [id number] DOB: 1960/11/06 Today's Date: 01/14/2014    History of present illness pt presents with R MCA Infarct.     OT comments  Pt demonstrates improved use of Lt. UE and problem solving during BADLs today.  He performed BADLs today with min guard assist.   Pt is eager to discharge home.  He is aware of need for 24 hour supervision/assist.  Instruction re: no driving, no mowing, no use of power tools, or sharp objects provided with pt verbalizing understanding.   Follow Up Recommendations  Outpatient OT;Supervision/Assistance - 24 hour    Equipment Recommendations  None recommended by OT    Recommendations for Other Services      Precautions / Restrictions Precautions Precautions: Fall Precaution Comments: Lt neglect/inattention Restrictions Weight Bearing Restrictions: No       Mobility Bed Mobility Overal bed mobility: Modified Independent                Transfers Overall transfer level: Needs assistance Equipment used: None Transfers: Sit to/from [employer] Sit to Stand: Min guard Stand pivot transfers: Min guard       General transfer comment: cues for attending to L UE.      Balance Overall balance assessment: Needs assistance Sitting-balance support: No upper extremity supported Sitting balance-Leahy Scale: Good Sitting balance - Comments: Patient able to sit EOB with cues for upright posture   Standing balance support: No upper extremity supported Standing balance-Leahy Scale: Good                     ADL   Eating/Feeding: Set up   Grooming: Oral care;Standing (min guard) Grooming Details (indicate cue type and reason): Pt spontaneously incorporates Lt. UE.  He dropped items on floor and required min guard assist to retrieve them.  Upper Body Bathing: Supervision/ safety;Sitting   Lower Body Bathing: Min guard;Sit to/from stand        Lower Body Dressing: Min guard;Sit to/from stand   Toilet Transfer: Min guard;Ambulation;Comfort height toilet   Toileting- Clothing Manipulation and Hygiene: Min guard;Sit to/from stand       Functional mobility during ADLs: Min guard General ADL Comments: Pt significantl improved with ability to problem solve during BADLs, spontaneously attempting to use Lt. UE.  Pt able to begin to manipulate items in Lt. UE when he is visually attending to them, but does drop items when not visually attending to them.  He ran into items only x 2 today with no cues provided.  Instructed pt in no driving, no use of sharp objects or tools, no mowing grass.  He verbalized understanding       Vision                     Perception     Praxis      Cognition   Behavior During Therapy: WFL for tasks assessed/performed Overall Cognitive Status: Impaired/Different from baseline Area of Impairment: Safety/judgement;Problem solving;Awareness          Safety/Judgement: Decreased awareness of deficits Awareness: Anticipatory Problem Solving: Slow processing;Difficulty sequencing;Requires verbal cues;Requires tactile cues General Comments: Pt demonstrates improved problem solving during BADLs.  He required increased time to correct errors, but did not require cues or assist from therapist    Extremity/Trunk Assessment               Exercises     Shoulder Instructions  General Comments      Pertinent Vitals/ Pain       Denies pain.    Home Living                                          Prior Functioning/Environment              Frequency Min 3X/week     Progress Toward Goals  OT Goals(current goals can now be found in the care plan section)  Progress towards OT goals: Progressing toward goals  Acute Rehab OT Goals OT Goal Formulation: With patient/family Time For Goal Achievement: 01/19/14 Potential to Achieve Goals: Good ADL Goals Pt  Will Perform Eating: with set-up;with supervision;sitting Pt Will Perform Grooming: with supervision;standing Pt Will Perform Upper Body Bathing: sitting;with supervision Pt Will Perform Lower Body Bathing: sit to/from stand;with min guard assist Pt Will Perform Upper Body Dressing: with min assist;sitting Pt Will Perform Lower Body Dressing: with min assist;sit to/from stand Pt Will Transfer to Toilet: with supervision;ambulating;regular height toilet Pt Will Perform Toileting - Clothing Manipulation and hygiene: with supervision;sit to/from stand Pt Will Perform Tub/Shower Transfer: with min guard assist;ambulating;shower seat  Plan Discharge plan remains appropriate    Co-evaluation                 End of Session     Activity Tolerance Patient tolerated treatment well   Patient Left in bed;with call bell/phone within reach;with family/visitor present   Nurse Communication Mobility status        Time: 3419-3790 OT Time Calculation (min): 43 min  Charges: OT General Charges $OT Visit: 1 Procedure OT Treatments $Self Care/Home Management : 38-52 mins  Johntavius Shepard M 01/14/2014, 1:19 PM

## 2014-01-14 NOTE — Progress Notes (Signed)
  Echocardiogram 2D Echocardiogram (limited with Definity) has been performed.  [place], [place] 01/14/2014, 2:05 PM

## 2014-01-14 NOTE — Progress Notes (Signed)
BENEFIT CHECK FOR XARELTO  01/14/2014 1048 by NIA SHEALY---PT COPAY WILL BE $30-NO PRIOR AUTH REQUIRED

## 2014-01-14 NOTE — Progress Notes (Signed)
Subjective: No complaints.  Laying flat in bed.  Objective: Vital signs in last 24 hours: Temp:  [97.5 F (36.4 C)-98.5 F (36.9 C)] 97.9 F (36.6 C) (04/07 0929) Pulse Rate:  [39-87] 81 (04/07 0929) Resp:  [18-20] 18 (04/07 0929) BP: (100-126)/(72-82) 118/82 mmHg (04/07 0929) SpO2:  [96 %-100 %] 99 % (04/07 0929) Last BM Date: 01/08/14  Intake/Output from previous day: 04/06 0701 - 04/07 0700 In: 60 [P.O.:60] Out: -  Intake/Output this shift: Total I/O In: 90 [P.O.:90] Out: -   Medications Current Facility-Administered Medications  Medication Dose Route Frequency Provider Last Rate Last Dose  . acetaminophen (TYLENOL) tablet 650 mg  650 mg Oral Q6H PRN Donne Hazel, MD   650 mg at 01/11/14 1605  . atorvastatin (LIPITOR) tablet 10 mg  10 mg Oral q1800 David L Rinehuls, PA-C   10 mg at 01/13/14 1736  . hydrALAZINE (APRESOLINE) injection 5 mg  5 mg Intravenous Q4H PRN Donne Hazel, MD   5 mg at 01/11/14 1935  . HYDROcodone-acetaminophen (NORCO/VICODIN) 5-325 MG per tablet 1 tablet  1 tablet Oral Q6H PRN Theodis Blaze, MD   1 tablet at 01/14/14 0425  . insulin aspart (novoLOG) injection 0-9 Units  0-9 Units Subcutaneous TID WC Theodis Blaze, MD   1 Units at 01/14/14 0804  . lisinopril (PRINIVIL,ZESTRIL) tablet 40 mg  40 mg Oral Daily Donne Hazel, MD   40 mg at 01/13/14 1100  . metoprolol succinate (TOPROL-XL) 24 hr tablet 25 mg  25 mg Oral Daily Rhonda G Barrett, PA-C   25 mg at 01/13/14 1736  . nitroGLYCERIN (NITROSTAT) SL tablet 0.4 mg  0.4 mg Sublingual Q5 min PRN Donne Hazel, MD      . Rivaroxaban Alveda Reasons) tablet 20 mg  20 mg Oral Q supper Donne Hazel, MD   20 mg at 01/14/14 9678    PE: General appearance: alert, cooperative and no distress Lungs: clear to auscultation bilaterally Heart: regular rate and rhythm, S1, S2 normal, no murmur, click, rub or gallop Extremities: No LEE Pulses: Radials 2+, DPs 1+ Skin: Warm and dry Neurologic: Grossly  normal  Lab Results:  No results found for this basename: WBC, HGB, HCT, PLT,  in the last 72 hours BMET  Recent Labs  01/14/14 0720  NA 137  K 4.5  CL 99  CO2 20  GLUCOSE 131*  BUN 32*  CREATININE 1.25  CALCIUM 9.5    Assessment/Plan  53 y.o. male with no history of CAD. He has not seen a cardiologist since the 80's.  CRFs are DM, HTN, HL. He has a history of non-compliance w/ Rx. Per his wife, he would refuse his medications on a regular basis. He does not have any history of palpitations, tachycardia or any other abnormal rhythm. He never complains of chest pain. He developed gait problems and left arm weakness on 4/2. He went to [facility], was told he had a TIA but left AMA. He went to [place] emergency room on 4/3 with worsening symptoms. A CT of the head was performed which showed an acute CVA. He was transferred to [facility] and admitted for further evaluation and treatment.   Principal Problem:   Stroke  Mgt per Neuro and IM.   Active Problems:   PAF (paroxysmal atrial fibrillation), rapid ventricular response The patient converted spontaneously to SR yesterday afternoon.  No further Afib.  He was started on Xarelto and 25 of toprol  XL.  Per Dr. Aundra Dubin, use Dronedarone if recurrent afib.      CAD MI in the 43's.  Cathed at [facility] with no intervention.  2D  Echo:  EF 55-60%, grade one diastolic dysfunction, Hypokinesis of the apical myocardium.  Limited contrast echo pending to look for thrombus.  OP lexiscan.     Type II or unspecified type diabetes mellitus without mention of complication, not stated as uncontrolled  Mgt per IM.    Essential hypertension, benign  Lisinopril 40, Toprol XL 25.  BP is well controlled.    Hyperlipidemia  On lipitor    Tobacco use  Cessation discussed.      LOS: 4 days    HAGER, BRYAN PA-C 01/14/2014 10:05 AM  Patient seen, examined. Available data reviewed. Agree with findings, assessment, and plan as outlined by  Tarri Fuller, PA-C. Reviewed consult note of Dr Aundra Dubin. Reviewed plan from cardiac perspective with patient and his wife. Anticipate discharge this afternoon after he has a contrast-enhanced echo to better assess LV wall motion. He is appropriately on anticoagulation with Xarelto in the setting of stroke and documented PAF. Tele reviewed and shows sinus rhythm past 24 hours. Will need outpatient Lexiscan Myoview for cardiac risk-stratification (we will arrange).  Sherren Mocha, M.D. 01/14/2014 1:27 PM

## 2014-01-14 NOTE — Progress Notes (Addendum)
Stroke Team Progress Note  HISTORY Aaron Medina is a 53 y.o. male with a history of diabetes mellitus, hypertension and hyperlipidemia who developed onset of acute left side weakness and slurred speech at noon on 01/09/2014. There was no previous history of stroke nor TIA. He was on aspirin 81 mg per day. He went to [place] who referred him to [facility] and we was sent home. On 01/10/2014 he woke up and had actual worsening of his weakness on the left side. He went to the emergency room at [facility] where CT scan of his head showed acute right posterior frontal and likely involvement of right parietal region along the posterior aspect of the seventh fissure. He also had Carotid Doppler study which showed no significant carotid artery stenosis on either side and antegrade vertebral artery flow bilaterally. NIH stroke score was 4. He was not a tPA candidate secondary delay in arrival. He was transferred to [facility] for further stroke evaluation and treatment.   SUBJECTIVE His wife is at the bedside. Patient is ready to go home.  OBJECTIVE Most recent Vital Signs: Filed Vitals:   01/13/14 2143 01/14/14 0200 01/14/14 0659 01/14/14 0929  BP: 117/73 125/82 126/72 118/82  Pulse: 78 87 72 81  Temp: 98.5 F (36.9 C) 98.1 F (36.7 C) 97.8 F (36.6 C) 97.9 F (36.6 C)  TempSrc: Oral Oral Oral Oral  Resp: 18 18 18 18   Height:      Weight:      SpO2: 98% 96% 97% 99%   CBG (last 3)   Recent Labs  01/13/14 1653 01/13/14 2235 01/14/14 0643  GLUCAP 128* 136* 138*    IV Fluid Intake:     MEDICATIONS  . atorvastatin  10 mg Oral q1800  . insulin aspart  0-9 Units Subcutaneous TID WC  . lisinopril  40 mg Oral Daily  . metoprolol succinate  25 mg Oral Daily  . rivaroxaban  20 mg Oral Q supper   PRN:  acetaminophen, hydrALAZINE, HYDROcodone-acetaminophen, nitroGLYCERIN  Diet:  Carb Control thin  liquids Activity:  Up with assistance DVT Prophylaxis: rivaroxaban  CLINICALLY  SIGNIFICANT STUDIES Basic Metabolic Panel:   Recent Labs Lab 01/11/14 0557 01/14/14 0720  NA 137 137  K 4.1 4.5  CL 98 99  CO2 20 20  GLUCOSE 123* 131*  BUN 11 32*  CREATININE 0.96 1.25  CALCIUM 9.0 9.5  MG  --  2.4   Liver Function Tests: No results found for this basename: AST, ALT, ALKPHOS, BILITOT, PROT, ALBUMIN,  in the last 168 hours CBC:   Recent Labs Lab 01/10/14 1149 01/11/14 0557  WBC 8.8 8.4  NEUTROABS 5.1  --   HGB 15.4 16.2  HCT 45.3 46.1  MCV 85.2 84.7  PLT 293 272   Coagulation: No results found for this basename: LABPROT, INR,  in the last 168 hours Cardiac Enzymes:   Recent Labs Lab 01/10/14 1149 01/13/14 1722  TROPONINI <0.30 <0.30   Urinalysis: No results found for this basename: COLORURINE, APPERANCEUR, LABSPEC, PHURINE, GLUCOSEU, HGBUR, BILIRUBINUR, KETONESUR, PROTEINUR, UROBILINOGEN, NITRITE, LEUKOCYTESUR,  in the last 168 hours Lipid Panel    Component Value Date/Time   CHOL 159 01/11/2014 0557   TRIG 122 01/11/2014 0557   HDL 32* 01/11/2014 0557   CHOLHDL 5.0 01/11/2014 0557   VLDL 24 01/11/2014 0557   LDLCALC 103* 01/11/2014 0557   HgbA1C  Lab Results  Component Value Date   HGBA1C 7.0* 01/11/2014    Urine Drug  Screen:     Component Value Date/Time   LABOPIA POSITIVE* 01/11/2014 0226   COCAINSCRNUR NONE DETECTED 01/11/2014 0226   LABBENZ NONE DETECTED 01/11/2014 0226   AMPHETMU NONE DETECTED 01/11/2014 0226   THCU POSITIVE* 01/11/2014 0226   LABBARB NONE DETECTED 01/11/2014 0226    Alcohol Level: No results found for this basename: ETH,  in the last 168 hours  Dg Chest 2 View 01/10/2014    No active cardiopulmonary disease.    Ct Head Wo Contrast 01/10/2014    1. Focal hypoattenuation in the posterior right frontal lobe and likely parietal lobe along the posterior aspect of the sylvian fissure is most consistent with an acute nonhemorrhagic infarct.    Mr Brain Wo Contrast 01/10/2014    Acute ischemic right MCA territory infarct as above. No  intracranial hemorrhage.    MRA BRAIN:   1. No MRA evidence of high-grade flow-limiting stenosis, proximal branch occlusion, or intracranial aneurysm.  2. Attenuation of the distal right MCA branch vessels in region of right MCA territory infarct.     US Carotid Duplex Bilateral 01/10/2014    Bilateral atherosclerotic plaque, left subjectively greater than right, not resulting in a hemodynamically significant stenosis.     2D Echocardiogram  EF 550-60% in setting of apical hypokinesis listed indication for study asstroke, consider limited contrast echo to rule out LV apical thrombus.   TEE  EKG sinus rhythm rate 75 beats per minute. For complete results please see formal report.   Therapy Recommendations - outpatient speech and physical therapies   Physical Exam   Mental Status:  Alert, oriented, thought content appropriate. Mild dysarthria without evidence of aphasia. Speaks slowly.  Able to follow commands without difficulty.  Cranial Nerves:  II-Visual fields show no defict  III/IV/VI-Pupils were equal and reacted. Extraocular movements were full and conjugate.  V/VII-no facial numbness; mild left lower facial weakness.  VIII-normal.  X-mild dysarthria; symmetrical palatal movement.  Motor: Drift of left upper extremity with mild proximal and distal weakness; mild hip flexor weakness on the left; normal strength of right extremities proximally and distally; normal muscle tone throughout.  Sensory: Mild left neglect.and diminished touch/pin prick sensation Deep Tendon Reflexes: 2+ and symmetric.  Plantars: Mute bilaterally  Cerebellar: Normal finger-to-nose testing.  ASSESSMENT Mr. Aaron Medina is a 54 y.o. male presenting with dysarthria and left hemiparesis. MRI - acute ischemic right MCA territory infarct. Infarct felt to be embolic secondary to new atrial fibrillation that was recognized yesterday afternoon during PT session. On aspirin 81 mg orally every day prior to  admission. Now on rivaroxaban for secondary stroke prevention. Patient with resultant left hemiparesis, dysarthria, sensory neglect.  Stroke work up completed.  atrial fibrillation   Diabetes mellitus, uncontrolled - hemoglobin A1c 7.5, goal < 7.0  Hypertension  Hyperlipidemia - cholesterol 159; LDL 103, added lipitor 10 mg daily  Substance abuse  -  positive for opiates and THCU  Ongoing tobacco use  Hx MI in the [facility] day # 4  TREATMENT/PLAN  Continue Rivaroxaban for secondary stroke prevention. Cancel TEE and loop  F/u hypercoagulable panel  Risk factor modification - smoking cessation, substance abuse cessation, diabetes, lipids  Outpatient speech and physical therapies   Ok for discharge from stroke standpoint.  No further stroke workup indicated. Patient has a 10-15% risk of having another stroke over the next year, the highest risk is within 2 weeks of the most recent stroke/TIA (risk of having a stroke following a stroke or  TIA is the same). Ongoing risk factor control by Primary Care Physician Stroke Service will sign off. Please call should any needs arise. Follow up with Dr. Leonie Man, [facility], in 2 months.  Dr. Leonie Man discussed diagnosis, prognosis,  treatment options and plan of care with patient, wife and Dr. Wyline Copas.  Burnetta Sabin, MSN, RN, ANVP-BC, ANP-BC, [facility] Pager: [phone] 01/14/2014 10:20 AM  I have personally obtained a history, examined the patient, evaluated imaging results, and formulated the assessment and plan of care. I agree with the above.  Antony Contras, MD  To contact Stroke Continuity provider, please refer to [email or web address] After hours, contact General Neurology

## 2014-01-14 NOTE — Progress Notes (Signed)
Seen and agreed 01/14/2014 Arias Weinert Elizabeth PTA [phone] pager [phone] office    

## 2014-01-14 NOTE — Progress Notes (Signed)
Physical Therapy Treatment Patient Details Name: Aaron Medina MRN: [id number] DOB: 08/09/1961 Today's Date: 01/14/2014    History of Present Illness pt presents with R MCA Infarct.      PT Comments    Patient was able to tolerate therapy today. Patient able to  Sit EOB balance with cup transfers with both UE without dropping cup. Patient is motivated. Continue working with patient with emphasis on L side. Continue to recommend outpatient PT at this time with supervision to continue to progress with safety during ambulation.   Follow Up Recommendations  Outpatient PT;Supervision/Assistance - 24 hour     Equipment Recommendations  None recommended by PT    Recommendations for Other Services       Precautions / Restrictions Precautions Precautions: Fall Precaution Comments: Lt neglect/inattention Restrictions Weight Bearing Restrictions: No    Mobility  Bed Mobility Overal bed mobility: Modified Independent                Transfers Overall transfer level: Needs assistance Equipment used: None Transfers: Sit to/from Stand Sit to Stand: Min guard         General transfer comment: cues for attending to L UE.    Ambulation/Gait Ambulation/Gait assistance: Min guard Ambulation Distance (Feet): 200 Feet Assistive device: None Gait Pattern/deviations: Step-through pattern;Decreased stride length;Drifts right/left;Ataxic     General Gait Details: Patient able to [facility] in hall with without running into objects. Patient continues to drift with gait. Patient required cues for proper body mechaincs and posture with amb.    Stairs            Wheelchair Mobility    Modified Rankin (Stroke Patients Only) Modified Rankin (Stroke Patients Only) Pre-Morbid Rankin Score: No symptoms Modified Rankin: Moderately severe disability     Balance Overall balance assessment: Needs assistance Sitting-balance support: No upper extremity supported;Feet  supported Sitting balance-Leahy Scale: Good Sitting balance - Comments: Patient able to sit EOB with cues for upright posture   Standing balance support: No upper extremity supported Standing balance-Leahy Scale: Good                      Cognition Arousal/Alertness: Awake/alert Behavior During Therapy: WFL for tasks assessed/performed Overall Cognitive Status: Impaired/Different from baseline Area of Impairment: Safety/judgement;Problem solving;Awareness         Safety/Judgement: Decreased awareness of deficits Awareness: Anticipatory Problem Solving: Slow processing;Difficulty sequencing;Requires verbal cues;Requires tactile cues General Comments: Patient able to follow commands with cues    Exercises      General Comments        Pertinent Vitals/Pain Patient denies pain.    Home Living                      Prior Function            PT Goals (current goals can now be found in the care plan section) Progress towards PT goals: Progressing toward goals    Frequency  Min 4X/week    PT Plan Current plan remains appropriate    Co-evaluation             End of Session Equipment Utilized During Treatment: Gait belt Activity Tolerance: Patient tolerated treatment well Patient left: in chair;with call bell/phone within reach;with chair alarm set;with family/visitor present     Time: 9892-1194 PT Time Calculation (min): 33 min  Charges:  G Codes:      Bodin Gorka, SPTA 01/14/2014, 11:10 AM

## 2014-01-14 NOTE — Progress Notes (Signed)
SLP Cancellation Note  Patient Details Name: RUBENS CRANSTON MRN: [id number] DOB: December 01, 1960   Cancelled treatment:       Reason Eval/Treat Not Completed: Patient at procedure or test/unavailable;Other (comment) (speaking with MD, ran out of time to re-meet)   Kern Reap, MA, CCC-SLP 01/14/2014, 4:54 PM

## 2014-01-15 ENCOUNTER — Other Ambulatory Visit: Payer: Self-pay | Admitting: Physician Assistant

## 2014-01-15 ENCOUNTER — Other Ambulatory Visit: Payer: Self-pay | Admitting: Family Medicine

## 2014-01-15 ENCOUNTER — Telehealth: Payer: Self-pay | Admitting: *Deleted

## 2014-01-15 DIAGNOSIS — I2510 Atherosclerotic heart disease of native coronary artery without angina pectoris: Secondary | ICD-10-CM

## 2014-01-15 LAB — PROTEIN S, TOTAL: PROTEIN S AG TOTAL: 122 % (ref 60–150)

## 2014-01-15 LAB — PROTHROMBIN GENE MUTATION

## 2014-01-15 LAB — PROTEIN C, TOTAL: Protein C, Total: 79 % (ref 72–160)

## 2014-01-15 NOTE — Telephone Encounter (Signed)
She needs to contact her Job and have them fax over the FMLA form Also I need to know her exact job description and how long she needs to be out, FMLA allows 12 weeks

## 2014-01-15 NOTE — Telephone Encounter (Signed)
Wife will drop off papers for FMLA.   Will discuss more with MD on next visit.

## 2014-01-15 NOTE — Telephone Encounter (Signed)
Received call from patient wife.   Reports that she is going to need FMLA papers so that she can stay home and care for patient while recovering from CVA.   MD please advise.

## 2014-01-16 LAB — HIV ANTIBODY (ROUTINE TESTING W REFLEX): HIV 1&2 Ab, 4th Generation: NONREACTIVE

## 2014-01-16 LAB — COMPLEMENT, TOTAL

## 2014-01-16 NOTE — Telephone Encounter (Signed)
Medication filled x1 with no refills.  

## 2014-01-24 ENCOUNTER — Encounter: Payer: Self-pay | Admitting: Family Medicine

## 2014-01-24 ENCOUNTER — Ambulatory Visit (INDEPENDENT_AMBULATORY_CARE_PROVIDER_SITE_OTHER): Payer: 59 | Admitting: Family Medicine

## 2014-01-24 VITALS — BP 128/76 | HR 80 | Temp 98.3°F | Resp 16 | Ht 73.0 in | Wt 208.0 lb

## 2014-01-24 DIAGNOSIS — I6350 Cerebral infarction due to unspecified occlusion or stenosis of unspecified cerebral artery: Secondary | ICD-10-CM

## 2014-01-24 DIAGNOSIS — I4891 Unspecified atrial fibrillation: Secondary | ICD-10-CM

## 2014-01-24 DIAGNOSIS — E785 Hyperlipidemia, unspecified: Secondary | ICD-10-CM

## 2014-01-24 DIAGNOSIS — E119 Type 2 diabetes mellitus without complications: Secondary | ICD-10-CM

## 2014-01-24 DIAGNOSIS — I639 Cerebral infarction, unspecified: Secondary | ICD-10-CM

## 2014-01-24 DIAGNOSIS — K5900 Constipation, unspecified: Secondary | ICD-10-CM

## 2014-01-24 DIAGNOSIS — I480 Paroxysmal atrial fibrillation: Secondary | ICD-10-CM

## 2014-01-24 MED ORDER — POLYETHYLENE GLYCOL 3350 17 GM/SCOOP PO POWD: 17.0000 g | Freq: Every day | ORAL | Status: DC

## 2014-01-24 NOTE — Patient Instructions (Signed)
Continue current medications I will check on the physical therapy and decrease to twice a week New glucose meter - check fasting Forms will be sent in  F/U 2 monthd

## 2014-01-25 ENCOUNTER — Encounter: Payer: Self-pay | Admitting: Family Medicine

## 2014-01-25 DIAGNOSIS — K5900 Constipation, unspecified: Secondary | ICD-10-CM | POA: Insufficient documentation

## 2014-01-25 NOTE — Assessment & Plan Note (Signed)
He is currently on statin drug Lipitor

## 2014-01-25 NOTE — Assessment & Plan Note (Signed)
No recent atrial for ablation. He's on Toprol which I will continue to monitor this time. He will followup for stress test next week. He is also on Xarelto

## 2014-01-25 NOTE — Assessment & Plan Note (Signed)
Miralax given

## 2014-01-25 NOTE — Assessment & Plan Note (Signed)
His diabetes is fairly well-controlled continue metformin at the current dose

## 2014-01-25 NOTE — Progress Notes (Signed)
Patient ID: Aaron Medina, male   DOB: 03-27-1961, 53 y.o.   MRN: [id number]   Subjective:    Patient ID: Aaron Medina, male    DOB: [birth year], 53 y.o.   MRN: [id number]  Patient presents for Hospital F/U  patient here to followup hospital admission for stroke. Patient was admitted to [facility] on April 3 is CVA. [facility] was [facility] and had a CT scan earlier that day that was negative however he did not want to stay in the hospital for evaluation therefore he went home subsequently his weakness worsened as well as his speech and he was taken to [facility] CT and MRI that time showed a fall stroke and he was transferred to [facility] for further evaluation. During some physical therapy in the hallway he went into paroxysmal A. fib. He is currently on metoprolol for rate control as well as a blood thinner Xarelto. He is set up to have a nuclear stress test next week. Is currently in physical therapy 3 times a week of note is having some difficulty affording the co-pay which is $100 every time he goes. He is doing home exercises. He is unable to work at this time   Is that he's had some constipation over the past week or 2 and is unable to have a good bowel movement since being out of the hospital. She's not used any over-the-counter medications. Has not had any blood in the stool. He has been straining to Hospital discharge her labs were reviewed    Review Of Systems:  GEN- denies fatigue, fever, weight loss,weakness, recent illness HEENT- denies eye drainage, change in vision, nasal discharge, CVS- denies chest pain, palpitations RESP- denies SOB, cough, wheeze ABD- denies N/V, +change in stools, abd pain GU- denies dysuria, hematuria, dribbling, incontinence MSK- denies joint pain, muscle aches, injury Neuro- denies headache, dizziness, syncope, seizure activity       Objective:    BP 128/76  Pulse 80  Temp(Src) 98.3 F (36.8 C)  (Oral)  Resp 16  Ht 6\' 1"  (1.854 m)  Wt 208 lb (94.348 kg)  BMI 27.45 kg/m2 GEN- NAD, alert and oriented x3 HEENT- PERRL, EOMI, non injected sclera, pink conjunctiva, MMM, oropharynx clear CVS- RRR, no murmur RESP-CTAB ABD-NABS,soft,NT,ND NEURO- CNII-XII in tact, speech in tact, gait instability with slow gait and very cautious gait, weakness LUE, abnormal finger to nose Psych- normal affect and mood EXT- No edema Pulses- Radial, DP- 2+        Assessment & Plan:      Problem List Items Addressed This Visit   Type II or unspecified type diabetes mellitus without mention of complication, not stated as uncontrolled     His diabetes is fairly well-controlled continue metformin at the current dose    Stroke - Primary     He still has residual weakness on the left side as well as difficulty with coordination his speech is back at normal. He will need continued physical therapy but we will have to decrease to twice a week as he cannot afford 3 times a week. 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 His wife also requesting FMLA to help assist him for the next few weeks. He will followup with neurology and also have his stress test done with cardiology next week.    PAF (paroxysmal atrial fibrillation), rapid ventricular response  No recent atrial for ablation. He's on Toprol which I will continue to monitor this time. He will followup for stress test next week. He is also on Xarelto    Hyperlipidemia     He is currently on statin drug Lipitor       Note: This dictation was prepared with Dragon dictation along with smaller phrase technology. Any transcriptional errors that result from this process are unintentional.

## 2014-01-25 NOTE — Assessment & Plan Note (Signed)
He still has residual weakness on the left side as well as difficulty with coordination his speech is back at normal. He will need continued physical therapy but we will have to decrease to twice a week as he cannot afford 3 times a week. 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 His wife also requesting FMLA to help assist him for the next few weeks. He will followup with neurology and also have his stress test done with cardiology next week.

## 2014-01-28 ENCOUNTER — Telehealth: Payer: Self-pay | Admitting: *Deleted

## 2014-01-28 ENCOUNTER — Ambulatory Visit (HOSPITAL_COMMUNITY): Payer: 59 | Attending: Cardiovascular Disease | Admitting: Radiology

## 2014-01-28 VITALS — BP 130/90 | HR 95 | Ht 73.0 in | Wt 212.0 lb

## 2014-01-28 DIAGNOSIS — R55 Syncope and collapse: Secondary | ICD-10-CM

## 2014-01-28 DIAGNOSIS — I2510 Atherosclerotic heart disease of native coronary artery without angina pectoris: Secondary | ICD-10-CM | POA: Insufficient documentation

## 2014-01-28 MED ORDER — TECHNETIUM TC 99M SESTAMIBI GENERIC - CARDIOLITE
33.0000 | Freq: Once | INTRAVENOUS | Status: AC | PRN
Start: 2014-01-28 — End: 2014-01-28
  Administered 2014-01-28: 33 via INTRAVENOUS

## 2014-01-28 MED ORDER — REGADENOSON 0.4 MG/5ML IV SOLN
0.4000 mg | Freq: Once | INTRAVENOUS | Status: AC
  Administered 2014-01-28: 0.4 mg via INTRAVENOUS

## 2014-01-28 MED ORDER — TECHNETIUM TC 99M SESTAMIBI GENERIC - CARDIOLITE
11.0000 | Freq: Once | INTRAVENOUS | Status: AC | PRN
  Administered 2014-01-28: 11 via INTRAVENOUS

## 2014-01-28 NOTE — Progress Notes (Addendum)
[place] [address] [place], [place] [address] [phone]    Cardiology Nuclear Med Study  Aaron Medina is a 53 y.o. male     MRN : [id number]     DOB: 12-14-1960  Procedure Date: 01/28/2014  Nuclear Med Background Indication for Stress Test:  Evaluation for Ischemia, [facility] 4/15 A-Fib, CVA and Abnormal EKG History:  CAD, MI, Cath (LAD 50-90% 1991), Afib, Echo 2015 EF 55-60% Cardiac Risk Factors: Carotid Disease, CVA, Hypertension, Lipids and NIDDM  Symptoms:  Near Syncope   Nuclear Pre-Procedure Caffeine/Decaff Intake:  None NPO After: 8:00pm   Lungs:  clear O2 Sat: 95% on room air. IV 0.9% NS with Angio Cath:  22g  IV Site: R Hand  IV Started by:  Crissie Figures, RN  Chest Size (in):  46 Cup Size: n/a  Height: 6\' 1"  (1.854 m)  Weight:  212 lb (96.163 kg)  BMI:  Body mass index is 27.98 kg/(m^2). Tech Comments:  N/A    Nuclear Med Study 1 or 2 day study: 1 day  Stress Test Type:  Lexiscan  Reading MD: N/A  Order Authorizing Provider:  Loralie Champagne, MD  Resting Radionuclide: Technetium 49m Sestamibi  Resting Radionuclide Dose: 11.0 mCi   Stress Radionuclide:  Technetium 81m Sestamibi  Stress Radionuclide Dose: 33.0 mCi           Stress Protocol Rest HR: 95 Stress HR: 98  Rest BP: 130/90 Stress BP: 125/76  Exercise Time (min): n/a METS: n/a           Dose of Adenosine (mg):  n/a Dose of Lexiscan: 0.4 mg  Dose of Atropine (mg): n/a Dose of Dobutamine: n/a mcg/kg/min (at max HR)  Stress Test Technologist: Glade Lloyd, BS-ES  Nuclear Technologist:  Charlton Amor, CNMT     Rest Procedure:  Myocardial perfusion imaging was performed at rest 45 minutes following the intravenous administration of Technetium 80m Sestamibi. Rest ECG: NSR, diffusele negative T waves  Stress Procedure:  The patient received IV Lexiscan 0.4 mg over 15-seconds.  Technetium 57m Sestamibi injected at 30-seconds.  Quantitative spect images  were obtained after a 45 minute delay. Stress ECG: No significant change from baseline ECG  QPS Raw Data Images:  Normal; no motion artifact; normal heart/lung ratio. Stress Images:  Small size severe severity defect in the distal inferior wall and in the true apex.  Rest Images:  Small size severe severity defect in the distal inferior wall and in the true apex.  Subtraction (SDS):  No evidence of ischemia. Transient Ischemic Dilatation (Normal <1.22):  1.09 Lung/Heart Ratio (Normal <0.45):  0.22  Quantitative Gated Spect Images QGS EDV:  179 ml QGS ESV:  100 ml  Impression Exercise Capacity:  Lexiscan with no exercise. BP Response:  Normal blood pressure response. Clinical Symptoms:  No significant symptoms noted. ECG Impression:  No significant ST segment change suggestive of ischemia. Comparison with Prior Nuclear Study: No images to compare  Overall Impression:  Low risk stress nuclear study with a small dense scar in the distal wrap around LAD with no evidence of ischemia. .  LV Ejection Fraction: 44%.  LV Wall Motion:  Akinesis of the distal inferior wall and the apex.   Dorothy Spark 01/28/2014  Evidence for prior infarction but no ischemia.  EF 44%.  Does not need catheterization.    Larey Dresser 01/29/2014 9:54 AM

## 2014-01-28 NOTE — Telephone Encounter (Signed)
Received call from patient wife, Levada Dy.   Requested to have MD authorize Handicap parking sticker if patient is appropriate.   MD please advise.

## 2014-01-28 NOTE — Telephone Encounter (Signed)
Okay to give temporary handicap for 6 months

## 2014-01-28 NOTE — Telephone Encounter (Signed)
Call placed to patient and patient wife, Levada Dy made aware.   Will pick up form on 01/29/2014.

## 2014-01-29 NOTE — Progress Notes (Signed)
Results reported to patient who verbalized understanding

## 2014-02-05 ENCOUNTER — Telehealth: Payer: Self-pay | Admitting: Family Medicine

## 2014-02-05 NOTE — Telephone Encounter (Signed)
Discussed with patient's wife. He's been stressed now because he's been getting papers from his job stating that he is to return to work and they're sending more forms that need to be completed. Advise her that this is all normal at this time and he'll let him know is that he is not stressed out about this. He is sleeping very well and otherwise is slowly improving with his physical therapy. He's not requesting any medication to help with stress at this time therefore we'll continue to monitor and see how he does

## 2014-02-12 ENCOUNTER — Telehealth: Payer: Self-pay | Admitting: *Deleted

## 2014-02-12 ENCOUNTER — Other Ambulatory Visit: Payer: Self-pay | Admitting: Family Medicine

## 2014-02-12 MED ORDER — LISINOPRIL 40 MG PO TABS: 40.0000 mg | ORAL_TABLET | Freq: Every day | ORAL | Status: DC

## 2014-02-12 NOTE — Telephone Encounter (Signed)
Please have take his lisinoril and toprol, then check BP 1 hour later and call me back with readings

## 2014-02-12 NOTE — Telephone Encounter (Signed)
Received call from patient wife Levada Dy.   Reports that BP has been elevated for the last few days. Readings are as follows with automatic cuff: 5/1- 160/103 5/2- 155/109 5/3- 148/102 5/4- 157/102 5/5- 176/101  5/1- 165/98   States that these readings are taken before BP medications are given.   MD please advise.

## 2014-02-12 NOTE — Telephone Encounter (Signed)
Call returned.   Patient BP noted 147/94 (1) hour after medication administration.

## 2014-02-12 NOTE — Telephone Encounter (Signed)
Call placed to patient and patient made aware.  

## 2014-02-12 NOTE — Telephone Encounter (Signed)
LMTRC.  Prescription sent to pharmacy.   

## 2014-02-12 NOTE — Telephone Encounter (Signed)
Increase lisinopril to 40mg  once a day, he can take 2 of the 20mg  Send new script F/U in office 1 week

## 2014-02-13 ENCOUNTER — Other Ambulatory Visit: Payer: Self-pay | Admitting: *Deleted

## 2014-02-13 MED ORDER — METFORMIN HCL ER 500 MG PO TB24: ORAL_TABLET | ORAL | Status: DC

## 2014-02-13 MED ORDER — RIVAROXABAN 20 MG PO TABS: 20.0000 mg | ORAL_TABLET | Freq: Every day | ORAL | Status: DC

## 2014-02-13 NOTE — Telephone Encounter (Signed)
Refill appropriate and filled per protocol. 

## 2014-02-13 NOTE — Telephone Encounter (Signed)
Call placed to patient and patient made aware.   Appointment scheduled.  

## 2014-02-14 ENCOUNTER — Other Ambulatory Visit: Payer: Self-pay | Admitting: *Deleted

## 2014-02-14 ENCOUNTER — Telehealth: Payer: Self-pay | Admitting: Family Medicine

## 2014-02-14 DIAGNOSIS — E785 Hyperlipidemia, unspecified: Secondary | ICD-10-CM

## 2014-02-14 DIAGNOSIS — I639 Cerebral infarction, unspecified: Secondary | ICD-10-CM

## 2014-02-14 MED ORDER — METOPROLOL SUCCINATE ER 25 MG PO TB24: 25.0000 mg | ORAL_TABLET | Freq: Every day | ORAL | Status: DC

## 2014-02-14 MED ORDER — ATORVASTATIN CALCIUM 10 MG PO TABS: 10.0000 mg | ORAL_TABLET | Freq: Every day | ORAL | Status: DC

## 2014-02-14 MED ORDER — RIVAROXABAN 20 MG PO TABS: 20.0000 mg | ORAL_TABLET | Freq: Every day | ORAL | Status: DC

## 2014-02-14 NOTE — Telephone Encounter (Signed)
Patient needs refill on his atorvastatin,metoprolol and other refills he does not understand why the pharmacy is saying he has not refills please call back at [phone]

## 2014-02-14 NOTE — Telephone Encounter (Signed)
Prescription sent to pharmacy. .   Call placed to patient and patient made aware.  

## 2014-02-21 ENCOUNTER — Ambulatory Visit (INDEPENDENT_AMBULATORY_CARE_PROVIDER_SITE_OTHER): Payer: 59 | Admitting: Family Medicine

## 2014-02-21 ENCOUNTER — Encounter: Payer: Self-pay | Admitting: Family Medicine

## 2014-02-21 VITALS — BP 136/96 | HR 76 | Temp 97.9°F | Resp 18 | Wt 218.0 lb

## 2014-02-21 DIAGNOSIS — I10 Essential (primary) hypertension: Secondary | ICD-10-CM

## 2014-02-21 LAB — BASIC METABOLIC PANEL
BUN: 9 mg/dL (ref 6–23)
CO2: 23 mEq/L (ref 19–32)
CREATININE: 0.97 mg/dL (ref 0.50–1.35)
Calcium: 9.9 mg/dL (ref 8.4–10.5)
Chloride: 106 mEq/L (ref 96–112)
GLUCOSE: 126 mg/dL — AB (ref 70–99)
POTASSIUM: 4.2 meq/L (ref 3.5–5.3)
Sodium: 139 mEq/L (ref 135–145)

## 2014-02-21 MED ORDER — METOPROLOL SUCCINATE ER 50 MG PO TB24: 50.0000 mg | ORAL_TABLET | Freq: Every day | ORAL | Status: DC

## 2014-02-21 NOTE — Progress Notes (Signed)
Patient ID: Aaron Medina, male   DOB: [birth year], 53 y.o.   MRN: [id number]   Subjective:    Patient ID: Aaron Medina, male    DOB: 08-16-1961, 53 y.o.   MRN: [id number]  Patient presents for follow up HTN  Patient here for interim visit on his blood pressure. His wife called because his systolic blood pressures recorded in the 160s to 170s. His goal is less than 130/80. He's been taking lisinopril which I increased to 40 mg about a week ago. He is also taking metoprolol 25 mg once a day. He's not had any headache dizziness chest pain. He is still in physical therapy secondary to his stroke. He still has some residual weakness on the left side   Review Of Systems:  GEN- + fatigue, fever, weight loss,weakness, recent illness HEENT- denies eye drainage, change in vision, nasal discharge, CVS- denies chest pain, palpitations RESP- denies SOB, cough, wheeze Neuro- denies headache, dizziness, syncope, seizure activity       Objective:    BP 136/96  Pulse 76  Temp(Src) 97.9 F (36.6 C) (Oral)  Resp 18  Wt 218 lb (98.884 kg) GEN- NAD, alert and oriented x3, Repeat BP 160/ 78  HEENT- PERRL, EOMI, non injected sclera, pink conjunctiva, MMM, oropharynx clear Neck- Supple,  CVS- RRR, no murmur RESP-CTAB EXT- No edema Pulses- Radial 2+        Assessment & Plan:      Problem List Items Addressed This Visit   Essential hypertension, benign - Primary     I will continue lisinopril at 40 mg will increase his metoprolol to 50 mg once a day    Relevant Medications      metoprolol succinate (TOPROL-XL) 24 hr tablet   Other Relevant Orders      Basic metabolic panel (Completed)      Note: This dictation was prepared with Dragon dictation along with smaller phrase technology. Any transcriptional errors that result from this process are unintentional.

## 2014-02-21 NOTE — Patient Instructions (Signed)
Increase metoprolol to 50mg - he can take 2 of the 25mg  tablets Lisinopril 40mg   We will call about the kidney function Change follow-up to 3rd week of July

## 2014-02-21 NOTE — Assessment & Plan Note (Addendum)
I will continue lisinopril at 40 mg will increase his metoprolol to 50 mg once a day Goal BP 130/ 80 Renal function to be rechecked

## 2014-03-26 ENCOUNTER — Ambulatory Visit: Payer: 59 | Admitting: Family Medicine

## 2014-04-23 ENCOUNTER — Encounter: Payer: Self-pay | Admitting: Nurse Practitioner

## 2014-04-23 ENCOUNTER — Encounter (INDEPENDENT_AMBULATORY_CARE_PROVIDER_SITE_OTHER): Payer: Self-pay

## 2014-04-23 ENCOUNTER — Ambulatory Visit (INDEPENDENT_AMBULATORY_CARE_PROVIDER_SITE_OTHER): Payer: BC Managed Care – PPO | Admitting: Nurse Practitioner

## 2014-04-23 VITALS — BP 99/72 | HR 78 | Ht 73.0 in | Wt 217.0 lb

## 2014-04-23 DIAGNOSIS — I480 Paroxysmal atrial fibrillation: Secondary | ICD-10-CM

## 2014-04-23 DIAGNOSIS — I63411 Cerebral infarction due to embolism of right middle cerebral artery: Secondary | ICD-10-CM

## 2014-04-23 DIAGNOSIS — I6340 Cerebral infarction due to embolism of unspecified cerebral artery: Secondary | ICD-10-CM

## 2014-04-23 DIAGNOSIS — I4891 Unspecified atrial fibrillation: Secondary | ICD-10-CM

## 2014-04-23 NOTE — Patient Instructions (Addendum)
Continue xarelto ( rivaroxaban) for secondary stroke prevention as he was found to have atrial fibrillation. Maintain strict control of hypertension with blood pressure goal below 140/90, diabetes with hemoglobin A1c goal below 7% and lipids with LDL cholesterol goal below 70 mg/dL.  I have recommended that he quit smoking tobacco and marijuana completely.  I have given him graduated return to driving instructions. He was advised to get regular cardiovascular exercise, 150 minutes each week, and to continue the exercises her was taught in PT and OT.  Followup in the future in 3-4 months, sooner as needed.  Stroke Prevention Some medical conditions and behaviors are associated with an increased chance of having a stroke. You may prevent a stroke by making healthy choices and managing medical conditions. HOW CAN I REDUCE MY RISK OF HAVING A STROKE?   Stay physically active. Get at least 30 minutes of activity on most or all days.  Do not smoke. It may also be helpful to avoid exposure to secondhand smoke.  Limit alcohol use. Moderate alcohol use is considered to be:  No more than 2 drinks per day for men.  No more than 1 drink per day for nonpregnant women.  Eat healthy foods. This involves  Eating 5 or more servings of fruits and vegetables a day.  Following a diet that addresses high blood pressure (hypertension), high cholesterol, diabetes, or obesity.  Manage your cholesterol levels.  A diet low in saturated fat, trans fat, and cholesterol and high in fiber may control cholesterol levels.  Take any prescribed medicines to control cholesterol as directed by your health care provider.  Manage your diabetes.  A controlled-carbohydrate, controlled-sugar diet is recommended to manage diabetes.  Take any prescribed medicines to control diabetes as directed by your health care provider.  Control your hypertension.  A low-salt (sodium), low-saturated fat, low-trans fat, and  low-cholesterol diet is recommended to manage hypertension.  Take any prescribed medicines to control hypertension as directed by your health care provider.  Maintain a healthy weight.  A reduced-calorie, low-sodium, low-saturated fat, low-trans fat, low-cholesterol diet is recommended to manage weight.  Stop drug abuse.  Avoid taking birth control pills.  Talk to your health care provider about the risks of taking birth control pills if you are over 10 years old, smoke, get migraines, or have ever had a blood clot.  Get evaluated for sleep disorders (sleep apnea).  Talk to your health care provider about getting a sleep evaluation if you snore a lot or have excessive sleepiness.  Take medicines as directed by your health care provider.  For some people, aspirin or blood thinners (anticoagulants) are helpful in reducing the risk of forming abnormal blood clots that can lead to stroke. If you have the irregular heart rhythm of atrial fibrillation, you should be on a blood thinner unless there is a good reason you cannot take them.  Understand all your medicine instructions.  Make sure that other other conditions (such as anemia or atherosclerosis) are addressed. SEEK IMMEDIATE MEDICAL CARE IF:   You have sudden weakness or numbness of the face, arm, or leg, especially on one side of the body.  Your face or eyelid droops to one side.  You have sudden confusion.  You have trouble speaking (aphasia) or understanding.  You have sudden trouble seeing in one or both eyes.  You have sudden trouble walking.  You have dizziness.  You have a loss of balance or coordination.  You have a sudden, severe headache  with no known cause.  You have new chest pain or an irregular heartbeat. Any of these symptoms may represent a serious problem that is an emergency. Do not wait to see if the symptoms will go away. Get medical help at once. Call your local emergency services  (911 in [place] Do  not drive yourself to the hospital. Document Released: 11/03/2004 Document Revised: 07/17/2013 Document Reviewed: 03/29/2013 [facility] Patient Information 2015 [place], [place]. This information is not intended to replace advice given to you by your health care provider. Make sure you discuss any questions you have with your health care provider.

## 2014-04-23 NOTE — Progress Notes (Signed)
PATIENT: Aaron Medina DOB: Aug 31, 1961  REASON FOR VISIT: hospital follow up for stroke HISTORY FROM: patient  HISTORY OF PRESENT ILLNESS: Aaron Medina is a 53 y.o. male who comes to the office today for first post-hospital discharge visit for stroke.  He has a history of diabetes mellitus, hypertension and hyperlipidemia who developed onset of acute left side weakness and slurred speech at noon on 01/09/2014. There was no previous history of stroke nor TIA. He was on aspirin 81 mg per day. He went to [place] who referred him to [facility] and we was sent home. On 01/10/2014 he woke up and had actual worsening of his weakness on the left side. He went to the emergency room at [facility] where CT scan of his head showed acute right posterior frontal and likely involvement of right parietal region along the posterior aspect of the seventh fissure. He was transferred to [facility] for further stroke evaluation and treatment.  Mri Brain showed an acute ischemic right MCA territory infarct. No MRA evidence of high-grade flow-limiting stenosis, proximal branch occlusion, or intracranial aneurysm.  2D Echocardiogram showed an EF 50-60%. He also had Carotid Doppler study which showed no significant carotid artery stenosis on either side and antegrade vertebral artery flow bilaterally. NIH stroke score was 4. He was not a tPA candidate secondary delay in arrival. He was transferred to [facility] for further stroke evaluation and treatment. LDL 106, Hgba1c 7.5. Atrial fibrillation was recognized during a PT session.  Now on rivaroxaban for secondary stroke prevention. He is tolerating it well without significant bruising or bleeding. Patient with resultant left hemiparesis, dysarthria, and sensory neglect. He has numbness in his left  arm and leg, without significant weakness.  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  He has not starting driving again.  He is still smoking  a few cigarettes a day, but is trying to quit. Blood pressure is well controlled, it is 99/72 in the office today.  He is trying to eat better, Hgb A1c was rechecked recently and was down to 7.0.  REVIEW OF SYSTEMS: Full 14 system review of systems performed and notable only for: fatigue, joint pain, joint swelling, back pain, aching muscles, walking difficulty, weakness, agitation.  ALLERGIES: No Known Allergies  HOME MEDICATIONS: Outpatient Prescriptions Prior to Visit  Medication Sig Dispense Refill  . atorvastatin (LIPITOR) 10 MG tablet Take 1 tablet (10 mg total) by mouth daily at 6 PM.  30 tablet  6  . Blood Glucose Monitoring Suppl (ONE TOUCH ULTRA 2) W/DEVICE KIT       . lisinopril (PRINIVIL,ZESTRIL) 40 MG tablet Take 1 tablet (40 mg total) by mouth daily.  90 tablet  3  . metFORMIN (GLUCOPHAGE-XR) 500 MG 24 hr tablet TAKE 1 TABLET BY MOUTH EVERY MORNING WITH BREAKFAST  30 tablet  3  . metoprolol succinate (TOPROL-XL) 50 MG 24 hr tablet Take 1 tablet (50 mg total) by mouth daily.  30 tablet  6  . multivitamin-iron-minerals-folic acid (CENTRUM) chewable tablet Chew 1 tablet by mouth at bedtime.       . ONE TOUCH ULTRA TEST test strip       . ONETOUCH DELICA LANCETS 62V MISC       . polyethylene glycol powder (GLYCOLAX/MIRALAX) powder Take 17 g by mouth daily.  3350 g  1  . rivaroxaban (XARELTO) 20 MG TABS tablet Take 1 tablet (20 mg total) by mouth daily with  supper.  30 tablet  3  . traMADol (ULTRAM) 50 MG tablet Take 1 tablet (50 mg total) by mouth every 8 (eight) hours as needed for pain.  30 tablet  3   No facility-administered medications prior to visit.    PHYSICAL EXAM Filed Vitals:   04/23/14 1415  BP: 99/72  Pulse: 78  Height: '6\' 1"'  (1.854 m)  Weight: 217 lb (98.431 kg)   Body mass index is 28.64 kg/(m^2).  Generalized: Well developed, in no acute distress  Head: normocephalic and atraumatic. Oropharynx benign  Neck: Supple, no carotid bruits  Cardiac: Regular  rate rhythm, no murmur  Musculoskeletal: No deformity   Neurological examination  Mentation: Alert oriented to time, place, history taking. Follows all commands. Speech is mildly dysarthric. Cranial nerve II-XII: Fundoscopic exam not done. Pupils were equal round reactive to light extraocular movements were full, visual field were full on confrontational test. Facial sensation and strength were normal. hearing was intact to finger rubbing bilaterally. Uvula tongue midline. head turning and shoulder shrug and were normal and symmetric.Tongue protrusion into cheek strength was normal. Motor: The motor testing reveals 5 over 5 strength of all 4 extremities. Good symmetric motor tone is noted throughout.  Sensory: Sensory testing is diminished to soft touch, and pinprick on the left arm and left leg. Evidence of extinction is noted. Sensory testing is normal on the right. Coordination: Cerebellar testing reveals dysmetria on finger-nose-finger on the left, normal on the right.  Heel-to-shin normal bilaterally.  Gait and station: Gait is normal. Tandem gait is unsteady. Romberg is negative. Reflexes: Deep tendon reflexes are symmetric and normal bilaterally.   ASSESSMENT: Mr. Aaron Medina is a 53 y.o. AA male presenting with dysarthria and left hemiparesis. MRI showed an acute ischemic right MCA territory infarct. Infarct felt to be embolic secondary to new atrial fibrillation that was recognized during PT session.  Now on rivaroxaban for secondary stroke prevention. Patient with resultant left hemiparesis, dysarthria, sensory neglect. Vascular risk factors of atrial fibrillation, Diabetes mellitus, Hypertension, Hyperlipidemia, Substance abuse - positive for opiates and THC, Ongoing tobacco use, Hx MI in the 80s.   PLAN: I had a long discussion with the patient and wife regarding his recent stroke, discussed results of evaluation in the hospital and answered questions. Continue xarelto (  rivaroxaban) for secondary stroke prevention as he was found to have atrial fibrillation. Maintain strict control of hypertension with blood pressure goal below 140/90, diabetes with hemoglobin A1c goal below 7% and lipids with LDL cholesterol goal below 70 mg/dL. I have recommended that he quit smoking tobacco and marijuana completely.  I have given him graduated return to driving instructions. He was advised to get regular cardiovascular exercise, 150 minutes each week, and to continue the exercises her was taught in PT and OT.  Followup in the future in 3-4 months, sooner as needed.  Rudi Rummage Eldon Zietlow, MSN, FNP-BC, A/GNP-C 04/23/2014, 2:25 PM [facility] [address], [place], [place] [address] [phone]  Note: This document was prepared with digital dictation and possible smart phrase technology. Any transcriptional errors that result from this process are unintentional.

## 2014-04-24 NOTE — Progress Notes (Signed)
I agree with above 

## 2014-04-28 ENCOUNTER — Ambulatory Visit: Payer: 59 | Admitting: Family Medicine

## 2014-06-11 ENCOUNTER — Other Ambulatory Visit: Payer: Self-pay | Admitting: Family Medicine

## 2014-06-12 ENCOUNTER — Other Ambulatory Visit: Payer: BC Managed Care – PPO

## 2014-06-12 DIAGNOSIS — E119 Type 2 diabetes mellitus without complications: Secondary | ICD-10-CM

## 2014-06-12 DIAGNOSIS — E785 Hyperlipidemia, unspecified: Secondary | ICD-10-CM

## 2014-06-12 DIAGNOSIS — Z0289 Encounter for other administrative examinations: Secondary | ICD-10-CM

## 2014-06-12 DIAGNOSIS — I10 Essential (primary) hypertension: Secondary | ICD-10-CM

## 2014-06-12 LAB — LIPID PANEL
Cholesterol: 110 mg/dL (ref 0–200)
HDL: 30 mg/dL — ABNORMAL LOW (ref >39)
LDL CALC: 60 mg/dL (ref 0–99)
Total CHOL/HDL Ratio: 3.7 Ratio
Triglycerides: 100 mg/dL (ref <150)
VLDL: 20 mg/dL (ref 0–40)

## 2014-06-12 LAB — BASIC METABOLIC PANEL
BUN: 9 mg/dL (ref 6–23)
CO2: 27 mEq/L (ref 19–32)
Calcium: 9.1 mg/dL (ref 8.4–10.5)
Chloride: 105 mEq/L (ref 96–112)
Creat: 1.07 mg/dL (ref 0.50–1.35)
Glucose, Bld: 123 mg/dL — ABNORMAL HIGH (ref 70–99)
POTASSIUM: 4.2 meq/L (ref 3.5–5.3)
SODIUM: 139 meq/L (ref 135–145)

## 2014-06-12 LAB — HEMOGLOBIN A1C
Hgb A1c MFr Bld: 7.1 % — ABNORMAL HIGH (ref <5.7)
MEAN PLASMA GLUCOSE: 157 mg/dL — AB (ref <117)

## 2014-06-21 ENCOUNTER — Other Ambulatory Visit: Payer: Self-pay | Admitting: *Deleted

## 2014-06-21 MED ORDER — METFORMIN HCL 850 MG PO TABS: 850.0000 mg | ORAL_TABLET | Freq: Two times a day (BID) | ORAL | Status: DC

## 2014-06-23 ENCOUNTER — Other Ambulatory Visit: Payer: Self-pay | Admitting: *Deleted

## 2014-06-23 MED ORDER — METFORMIN HCL 850 MG PO TABS: 850.0000 mg | ORAL_TABLET | Freq: Every day | ORAL | Status: DC

## 2014-06-26 ENCOUNTER — Telehealth: Payer: Self-pay | Admitting: *Deleted

## 2014-06-26 NOTE — Telephone Encounter (Signed)
Patient form sent to nurse [facility] 06/26/14, [facility].

## 2014-07-02 NOTE — Telephone Encounter (Signed)
[employer] form to [place], NP

## 2014-07-11 ENCOUNTER — Ambulatory Visit (INDEPENDENT_AMBULATORY_CARE_PROVIDER_SITE_OTHER): Payer: BC Managed Care – PPO | Admitting: Family Medicine

## 2014-07-11 ENCOUNTER — Encounter: Payer: Self-pay | Admitting: Family Medicine

## 2014-07-11 VITALS — BP 126/76 | HR 78 | Temp 98.3°F | Resp 18 | Ht 73.0 in | Wt 219.0 lb

## 2014-07-11 DIAGNOSIS — I10 Essential (primary) hypertension: Secondary | ICD-10-CM

## 2014-07-11 DIAGNOSIS — I639 Cerebral infarction, unspecified: Secondary | ICD-10-CM

## 2014-07-11 DIAGNOSIS — E785 Hyperlipidemia, unspecified: Secondary | ICD-10-CM

## 2014-07-11 DIAGNOSIS — E119 Type 2 diabetes mellitus without complications: Secondary | ICD-10-CM

## 2014-07-11 NOTE — Patient Instructions (Signed)
Continue current medicatons Do not take any aleve, ibuprofen, advil Take tylenol as needed for pain  Continue metformin  F/U 4 months

## 2014-07-12 ENCOUNTER — Encounter: Payer: Self-pay | Admitting: Family Medicine

## 2014-07-12 NOTE — Assessment & Plan Note (Signed)
Continue metformin , goal A1C closter to 6.5% due to stroke at young age

## 2014-07-12 NOTE — Assessment & Plan Note (Signed)
Reviewed neurology note, pt unable to perform his job which he has been doing for past 30 years Disability application in process

## 2014-07-12 NOTE — Assessment & Plan Note (Addendum)
Lipids at goal

## 2014-07-12 NOTE — Assessment & Plan Note (Signed)
BP looks good, no change to meds

## 2014-07-12 NOTE — Progress Notes (Signed)
Patient ID: Aaron Medina, male   DOB: 10-21-1960, 53 y.o.   MRN: [id number]   Subjective:    Patient ID: Aaron Medina, male    DOB: 1960-11-28, 52 y.o.   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 He is seeking disability with the help of his long term insurance company.  Fasting labs reviewed, A1C elevated at 7.1% Metformin increased to 850mg  once a day      Review Of Systems:  GEN- denies fatigue, fever, weight loss,+weakness, recent illness HEENT- denies eye drainage, change in vision, nasal discharge, CVS- denies chest pain, palpitations RESP- denies SOB, cough, wheeze ABD- denies N/V, change in stools, abd pain GU- denies dysuria, hematuria, dribbling, incontinence MSK- denies joint pain, muscle aches, injury Neuro- denies headache, dizziness, syncope, seizure activity       Objective:    BP 126/76  Pulse 78  Temp(Src) 98.3 F (36.8 C) (Oral)  Resp 18  Ht 6\' 1"  (1.854 m)  Wt 219 lb (99.338 kg)  BMI 28.90 kg/m2 GEN- NAD, alert and oriented x3 HEENT- PERRL, EOMI, non injected sclera, pink conjunctiva, MMM, oropharynx clear CVS- RRR, no murmur RESP-CTAB EXT- No edema Pulses- Radial 2+        Assessment & Plan:      Problem List Items Addressed This Visit   None      Note: This dictation was prepared with Dragon dictation along with smaller phrase technology. Any transcriptional errors that result from this process are unintentional.

## 2014-07-14 ENCOUNTER — Other Ambulatory Visit: Payer: Self-pay | Admitting: Family Medicine

## 2014-07-17 ENCOUNTER — Telehealth: Payer: Self-pay | Admitting: Neurology

## 2014-07-17 ENCOUNTER — Telehealth: Payer: Self-pay | Admitting: Family Medicine

## 2014-07-17 NOTE — Telephone Encounter (Signed)
I spoke to Dr Al Corpus disability specialist who pointed out illconsistencies between documented normal power on exam in office note on 04/23/14 by Charlott Holler and being not able to lift 10 pounds on disability form. Suggest repeat office f/u visit with [name] to address ill consistency in documentation and updated neuro exam.

## 2014-07-17 NOTE — Telephone Encounter (Signed)
Dr. Leonie Man please see note below

## 2014-07-17 NOTE — Telephone Encounter (Signed)
Dr Charlynn Grimes called regarding this patient and wanting to talk with you about his disability case please call him at [phone]

## 2014-07-17 NOTE — Telephone Encounter (Signed)
Dr. Charlynn Grimes, Neurologist @ [phone], need to speak with Dr. Leonie Man, treating MD regarding disability claim.  Please call and advise.

## 2014-07-18 NOTE — Telephone Encounter (Signed)
Aaron Medina has office visit scheduled already with Dr. Erlinda Hong. I spoke with [name] and she is ok with this appointment remaining.

## 2014-07-18 NOTE — Telephone Encounter (Signed)
I spoke with Dr. Al Medina there was some discrepancy regarding his prescriptions and then his overall examinations noted by neurology. They are to contact neurology about this and he is scheduled for followup of repeat examination. 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 He continues to have difficulty with coordination which I noted when I saw him in October regarding the left side. His gait overall was good I did not do a complete neurological exam therefore I cannot comment on his strength at all. He could likely do desk work if this was offered

## 2014-07-21 NOTE — Telephone Encounter (Signed)
Agree with above plan. 

## 2014-07-29 ENCOUNTER — Other Ambulatory Visit: Payer: Self-pay | Admitting: Family Medicine

## 2014-07-29 ENCOUNTER — Encounter: Payer: Self-pay | Admitting: Neurology

## 2014-07-29 ENCOUNTER — Ambulatory Visit (INDEPENDENT_AMBULATORY_CARE_PROVIDER_SITE_OTHER): Payer: BC Managed Care – PPO | Admitting: Neurology

## 2014-07-29 VITALS — BP 137/80 | HR 71 | Ht 73.0 in | Wt 195.0 lb

## 2014-07-29 DIAGNOSIS — E119 Type 2 diabetes mellitus without complications: Secondary | ICD-10-CM

## 2014-07-29 DIAGNOSIS — I63411 Cerebral infarction due to embolism of right middle cerebral artery: Secondary | ICD-10-CM

## 2014-07-29 DIAGNOSIS — E785 Hyperlipidemia, unspecified: Secondary | ICD-10-CM

## 2014-07-29 DIAGNOSIS — I10 Essential (primary) hypertension: Secondary | ICD-10-CM

## 2014-07-29 DIAGNOSIS — Z720 Tobacco use: Secondary | ICD-10-CM

## 2014-07-29 DIAGNOSIS — I480 Paroxysmal atrial fibrillation: Secondary | ICD-10-CM

## 2014-07-29 NOTE — Patient Instructions (Signed)
-   continue the xarelto and lipitor for stroke prevention - need close follow up with Dr. Buelah Manis for stroke risk factor modification - DM, HTN, HLD, CAD - quit smoking - talk to your therapist regarding PT for back pain and leg spasm - follow up as needed.

## 2014-07-29 NOTE — Progress Notes (Signed)
PATIENT: Aaron Medina DOB: 1961-01-29  REASON FOR VISIT: hospital follow up for stroke HISTORY FROM: patient  HISTORY OF PRESENT ILLNESS: Aaron Medina is a 53 y.o. male who comes to the office today for first post-hospital discharge visit for stroke.  He has a history of diabetes mellitus, hypertension and hyperlipidemia who developed onset of acute left side weakness and slurred speech at noon on 01/09/2014. There was no previous history of stroke nor TIA. He was on aspirin 81 mg per day. He went to [facility] who referred him to [facility] and we was sent home. On 01/10/2014 he woke up and had actual worsening of his weakness on the left side. He went to the emergency room at [facility] where CT scan of his head showed acute right posterior frontal and likely involvement of right parietal region along the posterior aspect of the seventh fissure. He was transferred to [facility] for further stroke evaluation and treatment.  Mri Brain showed an acute ischemic right MCA territory infarct. No MRA evidence of high-grade flow-limiting stenosis, proximal branch occlusion, or intracranial aneurysm.  2D Echocardiogram showed an EF 50-60%. He also had Carotid Doppler study which showed no significant carotid artery stenosis on either side and antegrade vertebral artery flow bilaterally. NIH stroke score was 4. He was not a tPA candidate secondary delay in arrival. He was transferred to [facility] for further stroke evaluation and treatment. LDL 106, Hgba1c 7.5. Atrial fibrillation was recognized during a PT session.  Now on rivaroxaban for secondary stroke prevention. He is tolerating it well without significant bruising or bleeding. Patient with resultant left hemiparesis, dysarthria, and sensory neglect. He has numbness in his left  arm and leg, without significant weakness.  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  He has not starting driving again.  He is still smoking  a few cigarettes a day, but is trying to quit. Blood pressure is well controlled, it is 99/72 in the office today.  He is trying to eat better, Hgb A1c was rechecked recently and was down to 7.0.  Interval history During the interval time, he has been doing well. Still has mild dexterity problem with left hand. He follows with PCP months ago, A1c still high at 7.1, so he is on metformin was increased to 850 mg twice a day. His LDL down to 60, responding to Lipitor. He still on Xarelto for stroke prevention, no side effect reported. He has not quit smoking yet, he still smokes at least 1/2PPD. His on lisinopril for blood pressure control, today blood pressure 137/80.  He complains that he he lying too long or sitting too long, he will have lower back pain and bilateral buttock pain, on walking he may have dragging on both sides but R>L.   REVIEW OF SYSTEMS: Full 14 system review of systems performed and notable only for: Fatigue, appetite change, restless leg, joint pain, back pain, walking difficulty.  ALLERGIES: No Known Allergies  HOME MEDICATIONS: Outpatient Prescriptions Prior to Visit  Medication Sig Dispense Refill  . atorvastatin (LIPITOR) 10 MG tablet Take 1 tablet (10 mg total) by mouth daily at 6 PM.  30 tablet  6  . Blood Glucose Monitoring Suppl (ONE TOUCH ULTRA 2) W/DEVICE KIT by Other route daily.       Marland Kitchen lisinopril (PRINIVIL,ZESTRIL) 40 MG tablet Take 1 tablet (40 mg total) by mouth daily.  90 tablet  3  . metFORMIN (GLUCOPHAGE)  850 MG tablet Take 1 tablet (850 mg total) by mouth daily with breakfast.  30 tablet  3  . metoprolol succinate (TOPROL-XL) 50 MG 24 hr tablet Take 1 tablet (50 mg total) by mouth daily.  30 tablet  6  . multivitamin-iron-minerals-folic acid (CENTRUM) chewable tablet Chew 1 tablet by mouth at bedtime.       . ONE TOUCH ULTRA TEST test strip 1 each by Other route daily.       Glory Rosebush DELICA LANCETS 35T MISC 1 each by Other route daily.       .  polyethylene glycol powder (GLYCOLAX/MIRALAX) powder Take 17 g by mouth daily.  3350 g  1  . traMADol (ULTRAM) 50 MG tablet Take 1 tablet (50 mg total) by mouth every 8 (eight) hours as needed for pain.  30 tablet  3  . XARELTO 20 MG TABS tablet TAKE 1 TABLET BY MOUTH EVERY DAY WITH SUPPER  30 tablet  2   No facility-administered medications prior to visit.    PHYSICAL EXAM Filed Vitals:   07/29/14 1526  BP: 137/80  Pulse: 71  Height: '6\' 1"'  (1.854 m)  Weight: 195 lb (88.451 kg)   Body mass index is 25.73 kg/(m^2).  General - Well nourished, well developed, in no apparent distress.  Ophthalmologic - Sharp disc margins OU.  Cardiovascular - Regular rate and rhythm with no murmur.  Mental Status -  Level of arousal and orientation to time, place, and person were intact. Language including expression, naming, repetition, comprehension was assessed and found intact. Attention span and concentration were impaired, not able to backward spelling WORLD. Fund of Knowledge was assessed and was intact.  Cranial Nerves II - XII - II - Visual field intact OU. III, IV, VI - Extraocular movements intact. V - Facial sensation intact bilaterally. VII - Facial movement intact bilaterally. VIII - Hearing & vestibular intact bilaterally. X - Palate elevates symmetrically. XI - Chin turning & shoulder shrug intact bilaterally. XII - Tongue protrusion intact.  Motor Strength - The patient's strength was 5/5 LUE but still has dexterity deficit with left hand and left pronator drift was present, LLE 5/5. RUE and RLE and 5/5. Bulk was normal and fasciculations were absent.   Motor Tone - Muscle tone was assessed at the neck and appendages and was normal.  Reflexes - The patient's reflexes were 1+ in all extremities and he had no pathological reflexes.  Sensory - Light touch, temperature/pinprick were assessed and were normal.    Coordination - The patient had normal movements in the hands with no  ataxia or dysmetria.  Tremor was absent.  Gait and Station - mild hemiparetic gait on the left.    ASSESSMENT: Mr. Aaron Medina is a 53 y.o. AA male  was admitted in April this year for dysarthria and left hemiparesis. MRI showed an acute ischemic right MCA territory infarct. Infarct felt to be embolic secondary to newly diagnosed atrial fibrillation that was recognized during PT session.  Now on rivaroxaban for secondary stroke prevention. Patient still has other stroke risk factors including diabetes mellitus, Hypertension, Hyperlipidemia, ongoing tobacco use, Hx MI in the 32s.   PLAN: - Continued dural and Lipitor for stroke prevention - Quit smoking, smoking cessation counseling again provided - Need close followup with her PCP for stroke risk factor modification, especially uncontrolled diabetes. - Continue PT OT, also talk to your therapist regarding evaluation and treatment for back pain and leg spasm - RTC when necessary  Rosalin Hawking, MD PhD [facility] [address], [place], [place] [address] [phone]  Patient Instructions  - continue the xarelto and lipitor for stroke prevention - need close follow up with Dr. Buelah Manis for stroke risk factor modification - DM, HTN, HLD, CAD - quit smoking - talk to your therapist regarding PT for back pain and leg spasm - follow up as needed.

## 2014-07-29 NOTE — Telephone Encounter (Signed)
I saw Mr. Funari this document in my clinic. He was doing well, still on Xarelto and Lipitor for stroke prevention. Blood pressure well controlled, LDL at the goal with Lipitor. His A1c still not at goal, but recently increase metformin dose.  In terms examination, he is fully orientated, no language deficit, however attention and concentration were impaired, not able to backward spelling " WORLD". Cranial nerves normal, motor exam only showed left hand dexterity deficit, with left-sided pronator drift. Lower extremity muscle strength 5/5, however he did have mild left hemiparetic gait. He also complains of lower back pain with both leg dragging intermittently. Those deficit may explain why he was labeled not able to lift up 10 pounds on the disability form.  He worked as a Metallurgist  prior to stroke. 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  Rosalin Hawking, MD PhD Stroke Neurology 07/29/2014 7:01 PM

## 2014-07-30 NOTE — Telephone Encounter (Signed)
okay

## 2014-07-30 NOTE — Telephone Encounter (Signed)
?   OK to Refill  - LOV - 07/11/2014 ; Last refill 06/21/13 #30/3

## 2014-07-30 NOTE — Telephone Encounter (Signed)
reviewed

## 2014-08-14 ENCOUNTER — Telehealth: Payer: Self-pay | Admitting: *Deleted

## 2014-08-14 DIAGNOSIS — Z0289 Encounter for other administrative examinations: Secondary | ICD-10-CM

## 2014-08-14 NOTE — Telephone Encounter (Signed)
Form,[employer] to [place] and Dr Erlinda Hong to be completed on 08-14-14.

## 2014-08-15 NOTE — Telephone Encounter (Signed)
Received form [employer] from Dr Erlinda Hong completed,faxed 08-15-14.

## 2014-08-26 ENCOUNTER — Telehealth: Payer: Self-pay | Admitting: Neurology

## 2014-08-26 ENCOUNTER — Other Ambulatory Visit: Payer: Self-pay | Admitting: Family Medicine

## 2014-08-26 NOTE — Telephone Encounter (Signed)
Dr. Charlynn Grimes @ [phone], requesting a return call from Dr. Leonie Man and Dr. Erlinda Hong regarding updated medical records received.  Please call and advise.

## 2014-08-26 NOTE — Telephone Encounter (Signed)
Refill appropriate and filled per protocol. 

## 2014-08-27 NOTE — Telephone Encounter (Signed)
I returned Dr. Rogelia Mire phone call and talked with him over the phone. Questions were answered.   Rosalin Hawking, MD PhD Stroke Neurology 08/27/2014 12:53 PM

## 2014-10-13 ENCOUNTER — Other Ambulatory Visit: Payer: Self-pay | Admitting: Family Medicine

## 2014-10-14 NOTE — Telephone Encounter (Signed)
Refill appropriate and filled per protocol. 

## 2014-10-28 ENCOUNTER — Other Ambulatory Visit: Payer: Self-pay | Admitting: Family Medicine

## 2014-10-28 NOTE — Telephone Encounter (Signed)
Refill appropriate and filled per protocol. 

## 2014-11-02 ENCOUNTER — Other Ambulatory Visit: Payer: Self-pay | Admitting: Family Medicine

## 2014-11-12 ENCOUNTER — Ambulatory Visit (INDEPENDENT_AMBULATORY_CARE_PROVIDER_SITE_OTHER): Payer: BLUE CROSS/BLUE SHIELD | Admitting: Family Medicine

## 2014-11-12 ENCOUNTER — Encounter: Payer: Self-pay | Admitting: Family Medicine

## 2014-11-12 VITALS — BP 128/74 | HR 72 | Temp 98.5°F | Resp 16 | Ht 73.0 in | Wt 210.0 lb

## 2014-11-12 DIAGNOSIS — M5410 Radiculopathy, site unspecified: Secondary | ICD-10-CM

## 2014-11-12 DIAGNOSIS — I63411 Cerebral infarction due to embolism of right middle cerebral artery: Secondary | ICD-10-CM

## 2014-11-12 DIAGNOSIS — M4310 Spondylolisthesis, site unspecified: Secondary | ICD-10-CM | POA: Insufficient documentation

## 2014-11-12 DIAGNOSIS — I10 Essential (primary) hypertension: Secondary | ICD-10-CM

## 2014-11-12 DIAGNOSIS — E785 Hyperlipidemia, unspecified: Secondary | ICD-10-CM

## 2014-11-12 DIAGNOSIS — E119 Type 2 diabetes mellitus without complications: Secondary | ICD-10-CM

## 2014-11-12 NOTE — Assessment & Plan Note (Signed)
Well controlled, no change to meds 

## 2014-11-12 NOTE — Progress Notes (Signed)
Patient ID: [name], male   DOB: 1961/02/27, 54 y.o.   MRN: [id number]   Subjective:    Patient ID: Aaron Medina, male    DOB: January 31, 1961, 54 y.o.   MRN: [id number]  Patient presents for 4 month F/U Pt here to f/u chronic medical problems DM- he has not checked sugar, due for fasting labs, he is taking meds as prescribed H/O Stroke with residual left UE weakness and dextererity problems of hand  Continues to have worsening low back pain worse on right side, feels like he drags his leg on that side, he will also have spells of shaking and weakness of the right lower ext that come and go. Denies paresthesia in his feet No change in bowel or bladder   Review Of Systems:  GEN-+ fatigue, denies fever, weight loss,weakness, recent illness HEENT- denies eye drainage, change in vision, nasal discharge, CVS- denies chest pain, palpitations RESP- denies SOB, cough, wheeze ABD- denies N/V, change in stools, abd pain GU- denies dysuria, hematuria, dribbling, incontinence MSK- + joint pain, +muscle aches, injury Neuro- denies headache, dizziness, syncope, seizure activity       Objective:    BP 128/74 mmHg  Pulse 72  Temp(Src) 98.5 F (36.9 C) (Oral)  Resp 16  Ht 6\' 1"  (1.854 m)  Wt 210 lb (95.255 kg)  BMI 27.71 kg/m2 GEN- NAD, alert and oriented x3 HEENT- PERRL, EOMI, non injected sclera, pink conjunctiva, MMM, oropharynx clear Neck- Supple, good ROM CVS- RRR, no murmur RESP-CTAB MSK - mild TTP lumbar spine, equivocalSLR right side Neuro- Normal tone LE, DTR 1+ bilat , strength 5/5 bilat EXT- No edema Pulses- Radial, DP- 2+        Assessment & Plan:      Problem List Items Addressed This Visit      Unprioritized   Type 2 diabetes mellitus without complication - Primary   Relevant Orders   CBC with Differential/Platelet   Comprehensive metabolic panel   Hemoglobin A1c   Microalbumin / creatinine urine ratio   Hyperlipidemia   Relevant Orders   Lipid  panel   Essential hypertension, benign   Back pain with right-sided radiculopathy      Note: This dictation was prepared with Dragon dictation along with smaller phrase technology. Any transcriptional errors that result from this process are unintentional.

## 2014-11-12 NOTE — Assessment & Plan Note (Signed)
He will get fasting labs this week Goal A1C < 7% On ACE and statin

## 2014-11-12 NOTE — Assessment & Plan Note (Signed)
persistant dexterity issues left side and some mild weakness Control risk factors

## 2014-11-12 NOTE — Assessment & Plan Note (Signed)
Worsening back pain with concerning neurological effects, ? Disc with impingment, or mild stenosis, will obtain MRI  L spine I think this is separate from the stroke as he had back pain before this

## 2014-11-12 NOTE — Patient Instructions (Signed)
MRI of spine to be scheduled Get the labs done fasting Handicap sticker Check sugars your fasting  F/U 3 months

## 2014-11-12 NOTE — Assessment & Plan Note (Signed)
Goal is LDL  70

## 2014-11-14 ENCOUNTER — Telehealth: Payer: Self-pay | Admitting: *Deleted

## 2014-11-14 NOTE — Telephone Encounter (Signed)
Pt has appointment at [facility] on Feb 15 at 1:00pm, arrive at 12:45pm for registration. Left message on pt's mobile to return call for appointment date/time.

## 2014-11-17 NOTE — Telephone Encounter (Signed)
Pt wife called pt and is aware of appt

## 2014-11-24 ENCOUNTER — Ambulatory Visit (HOSPITAL_COMMUNITY)
Admission: RE | Admit: 2014-11-24 | Discharge: 2014-11-24 | Disposition: A | Discharge status: Home / Self Care | Payer: BLUE CROSS/BLUE SHIELD | Source: Ambulatory Visit | Attending: Family Medicine | Admitting: Family Medicine

## 2014-11-24 DIAGNOSIS — M5416 Radiculopathy, lumbar region: Secondary | ICD-10-CM | POA: Diagnosis not present

## 2014-11-24 DIAGNOSIS — M545 Low back pain: Secondary | ICD-10-CM | POA: Diagnosis present

## 2014-11-24 DIAGNOSIS — M5410 Radiculopathy, site unspecified: Secondary | ICD-10-CM

## 2014-11-26 LAB — CBC WITH DIFFERENTIAL/PLATELET
BASOS PCT: 0 % (ref 0–1)
Basophils Absolute: 0 10*3/uL (ref 0.0–0.1)
Eosinophils Absolute: 0.1 10*3/uL (ref 0.0–0.7)
Eosinophils Relative: 1 % (ref 0–5)
HCT: 44.4 % (ref 39.0–52.0)
Hemoglobin: 14.8 g/dL (ref 13.0–17.0)
Lymphocytes Relative: 56 % — ABNORMAL HIGH (ref 12–46)
Lymphs Abs: 3.3 10*3/uL (ref 0.7–4.0)
MCH: 29.1 pg (ref 26.0–34.0)
MCHC: 33.3 g/dL (ref 30.0–36.0)
MCV: 87.2 fL (ref 78.0–100.0)
MPV: 9.1 fL (ref 8.6–12.4)
Monocytes Absolute: 0.5 10*3/uL (ref 0.1–1.0)
Monocytes Relative: 8 % (ref 3–12)
NEUTROS PCT: 35 % — AB (ref 43–77)
Neutro Abs: 2.1 10*3/uL (ref 1.7–7.7)
PLATELETS: 294 10*3/uL (ref 150–400)
RBC: 5.09 MIL/uL (ref 4.22–5.81)
RDW: 14.3 % (ref 11.5–15.5)
WBC: 5.9 10*3/uL (ref 4.0–10.5)

## 2014-11-26 LAB — LIPID PANEL
Cholesterol: 99 mg/dL (ref 0–200)
HDL: 28 mg/dL — ABNORMAL LOW (ref >39)
LDL CALC: 53 mg/dL (ref 0–99)
Total CHOL/HDL Ratio: 3.5 Ratio
Triglycerides: 90 mg/dL (ref <150)
VLDL: 18 mg/dL (ref 0–40)

## 2014-11-26 LAB — COMPREHENSIVE METABOLIC PANEL
ALBUMIN: 4.1 g/dL (ref 3.5–5.2)
ALT: 12 U/L (ref 0–53)
AST: 11 U/L (ref 0–37)
Alkaline Phosphatase: 78 U/L (ref 39–117)
BUN: 9 mg/dL (ref 6–23)
CALCIUM: 9.2 mg/dL (ref 8.4–10.5)
CHLORIDE: 105 meq/L (ref 96–112)
CO2: 26 mEq/L (ref 19–32)
Creat: 1.06 mg/dL (ref 0.50–1.35)
GLUCOSE: 113 mg/dL — AB (ref 70–99)
POTASSIUM: 4.5 meq/L (ref 3.5–5.3)
Sodium: 140 mEq/L (ref 135–145)
Total Bilirubin: 0.5 mg/dL (ref 0.2–1.2)
Total Protein: 6.9 g/dL (ref 6.0–8.3)

## 2014-11-26 LAB — MICROALBUMIN / CREATININE URINE RATIO
Creatinine, Urine: 287.5 mg/dL
Microalb Creat Ratio: 3.5 mg/g (ref 0.0–30.0)
Microalb, Ur: 1 mg/dL (ref <2.0)

## 2014-11-26 LAB — HEMOGLOBIN A1C
HEMOGLOBIN A1C: 6.8 % — AB (ref <5.7)
MEAN PLASMA GLUCOSE: 148 mg/dL — AB (ref <117)

## 2014-11-27 ENCOUNTER — Other Ambulatory Visit: Payer: Self-pay | Admitting: *Deleted

## 2014-11-27 DIAGNOSIS — M5416 Radiculopathy, lumbar region: Secondary | ICD-10-CM

## 2014-11-27 DIAGNOSIS — M5136 Other intervertebral disc degeneration, lumbar region: Secondary | ICD-10-CM

## 2014-11-27 DIAGNOSIS — M47819 Spondylosis without myelopathy or radiculopathy, site unspecified: Secondary | ICD-10-CM

## 2014-11-27 DIAGNOSIS — M5126 Other intervertebral disc displacement, lumbar region: Secondary | ICD-10-CM

## 2014-12-26 DIAGNOSIS — Z0289 Encounter for other administrative examinations: Secondary | ICD-10-CM

## 2014-12-29 ENCOUNTER — Telehealth: Payer: Self-pay | Admitting: Family Medicine

## 2014-12-29 NOTE — Telephone Encounter (Signed)
noted 

## 2014-12-29 NOTE — Telephone Encounter (Signed)
Call placed to [facility], patient wife.   Reports that patient is going to try to have dental work to get dentures.   States that she is concerned about patient since he is on Xarelto.   Advised that once patient sees dentist or dental surgeon, they will contact our office for clearance at which time we will discuss Xarelto and how to manage medication.

## 2014-12-29 NOTE — Telephone Encounter (Signed)
[phone] PT wife is calling to speak to you about her husband because he is needing dental work but due to some medication he is on she wants to know how about she needs to do that.

## 2015-01-07 ENCOUNTER — Telehealth: Payer: Self-pay | Admitting: *Deleted

## 2015-01-07 NOTE — Telephone Encounter (Signed)
Received Disability form from [place] term Disablity case manager requesting medical records from Oct 2015 to forward along with the attached restrictions forms in order for patient to return back to work when appropriate.I filled out the section of Provider signature and copied all the records that are requested.  Routed to Dr. Buelah Manis for review and signature.

## 2015-01-12 NOTE — Telephone Encounter (Signed)
Disability forms has been faxed to to [facility] at [phone]

## 2015-01-15 ENCOUNTER — Other Ambulatory Visit: Payer: Self-pay | Admitting: Family Medicine

## 2015-01-15 NOTE — Telephone Encounter (Signed)
Refill appropriate and filled per protocol. 

## 2015-01-21 ENCOUNTER — Telehealth: Payer: Self-pay | Admitting: Family Medicine

## 2015-01-21 NOTE — Telephone Encounter (Signed)
He can take claritin, use flonase OTC or nasal saline  If has cough- [place]

## 2015-01-21 NOTE — Telephone Encounter (Signed)
Call placed to patient and patient made aware.  

## 2015-01-21 NOTE — Telephone Encounter (Signed)
Patient's wife Aaron Medina is calling to saying that her husband is having sinus issues, and would like to know what is going to be safe for him to take with his blood pressure issues  [phone]

## 2015-01-21 NOTE — Telephone Encounter (Signed)
MD please advise

## 2015-01-24 ENCOUNTER — Other Ambulatory Visit: Payer: Self-pay | Admitting: Family Medicine

## 2015-01-26 NOTE — Telephone Encounter (Signed)
Medication refilled per protocol. 

## 2015-02-13 ENCOUNTER — Encounter: Payer: Self-pay | Admitting: Family Medicine

## 2015-02-13 ENCOUNTER — Ambulatory Visit (INDEPENDENT_AMBULATORY_CARE_PROVIDER_SITE_OTHER): Payer: BLUE CROSS/BLUE SHIELD | Admitting: Family Medicine

## 2015-02-13 VITALS — BP 138/84 | HR 72 | Temp 98.1°F | Resp 14 | Ht 73.0 in | Wt 218.0 lb

## 2015-02-13 DIAGNOSIS — M4310 Spondylolisthesis, site unspecified: Secondary | ICD-10-CM | POA: Diagnosis not present

## 2015-02-13 DIAGNOSIS — E785 Hyperlipidemia, unspecified: Secondary | ICD-10-CM | POA: Diagnosis not present

## 2015-02-13 DIAGNOSIS — I10 Essential (primary) hypertension: Secondary | ICD-10-CM

## 2015-02-13 DIAGNOSIS — E119 Type 2 diabetes mellitus without complications: Secondary | ICD-10-CM | POA: Diagnosis not present

## 2015-02-13 MED ORDER — TRAMADOL HCL 50 MG PO TABS: ORAL_TABLET | ORAL | Status: DC

## 2015-02-13 NOTE — Assessment & Plan Note (Signed)
Cholesterol will be rechecked. He is on statin drug

## 2015-02-13 NOTE — Assessment & Plan Note (Signed)
Blood pressure is well-controlled and change her medication. 

## 2015-02-13 NOTE — Patient Instructions (Signed)
Return for fasting labs at end of May  Continue current medications Pain medication refilled F/U 4 months

## 2015-02-13 NOTE — Assessment & Plan Note (Signed)
Spondylolisthesis with disc bulging. Reviewed neurosurgery note. He does not want surgical intervention at this time. We will continue with pain control with tramadol he also has a back brace

## 2015-02-13 NOTE — Progress Notes (Signed)
Patient ID: Aaron Medina, male   DOB: October 24, 1960, 54 y.o.   MRN: [id number]   Subjective:    Patient ID: Aaron Medina, male    DOB: 11/26/1960, 54 y.o.   MRN: [id number]  Patient presents for 4 month F/u  patient to follow-up chronic medical problems. He has no new concerns. He was seen by neurosurgery secondary to his spondylolisthesis and bulging disc states that down the road he will need surgical intervention but at this time he is going to hold off. They did recommend a back brace which he uses and continue physical therapy. He still has pain that radiates down his right leg he feels like this is weaker and he drags that time. He is still on leave from his job secondary to stroke. Medications were reviewed. He has not been checking his blood sugars on a regular basis. He is due for fasting labs at the end of the month. He has gained 8 pounds since her last visit.    Review Of Systems:  GEN- denies fatigue, fever, weight loss,weakness, recent illness HEENT- denies eye drainage, change in vision, nasal discharge, CVS- denies chest pain, palpitations RESP- denies SOB, cough, wheeze ABD- denies N/V, change in stools, abd pain GU- denies dysuria, hematuria, dribbling, incontinence MSK- + joint pain, muscle aches, injury Neuro- denies headache, dizziness, syncope, seizure activity       Objective:    BP 138/84 mmHg  Pulse 72  Temp(Src) 98.1 F (36.7 C) (Oral)  Resp 14  Ht 6\' 1"  (1.854 m)  Wt 218 lb (98.884 kg)  BMI 28.77 kg/m2 GEN- NAD, alert and oriented x3 HEENT- PERRL, EOMI, non injected sclera, pink conjunctiva, MMM, oropharynx clear CVS- RRR, no murmur RESP-CTAB EXT- No edema Pulses- Radial, DP- 2+        Assessment & Plan:      Problem List Items Addressed This Visit    None      Note: This dictation was prepared with Dragon dictation along with smaller phrase technology. Any transcriptional errors that result from this process are unintentional.

## 2015-02-13 NOTE — Assessment & Plan Note (Signed)
I will repeat his A1c has goals and A1c less than 7% especially with early stroke. He will continue the metformin advised to check his blood sugars and to work on dietary changes that she is gaining weight. Due to finances at this time there than a try to trim the nails themselves while being very careful

## 2015-02-25 ENCOUNTER — Encounter: Payer: Self-pay | Admitting: Family Medicine

## 2015-02-28 ENCOUNTER — Other Ambulatory Visit: Payer: Self-pay | Admitting: Family Medicine

## 2015-03-02 NOTE — Telephone Encounter (Signed)
Medication refilled per protocol. 

## 2015-04-03 ENCOUNTER — Other Ambulatory Visit: Payer: Self-pay | Admitting: Family Medicine

## 2015-04-03 ENCOUNTER — Other Ambulatory Visit: Payer: BLUE CROSS/BLUE SHIELD

## 2015-04-03 DIAGNOSIS — E119 Type 2 diabetes mellitus without complications: Secondary | ICD-10-CM

## 2015-04-03 DIAGNOSIS — E785 Hyperlipidemia, unspecified: Secondary | ICD-10-CM

## 2015-04-03 LAB — CBC WITH DIFFERENTIAL/PLATELET
Basophils Absolute: 0 10*3/uL (ref 0.0–0.1)
Basophils Relative: 0 % (ref 0–1)
Eosinophils Absolute: 0.1 10*3/uL (ref 0.0–0.7)
Eosinophils Relative: 1 % (ref 0–5)
HEMATOCRIT: 43.5 % (ref 39.0–52.0)
Hemoglobin: 14.6 g/dL (ref 13.0–17.0)
LYMPHS PCT: 50 % — AB (ref 12–46)
Lymphs Abs: 3.5 10*3/uL (ref 0.7–4.0)
MCH: 28.6 pg (ref 26.0–34.0)
MCHC: 33.6 g/dL (ref 30.0–36.0)
MCV: 85.3 fL (ref 78.0–100.0)
MONO ABS: 0.4 10*3/uL (ref 0.1–1.0)
MONOS PCT: 6 % (ref 3–12)
MPV: 9.2 fL (ref 8.6–12.4)
Neutro Abs: 3 10*3/uL (ref 1.7–7.7)
Neutrophils Relative %: 43 % (ref 43–77)
Platelets: 291 10*3/uL (ref 150–400)
RBC: 5.1 MIL/uL (ref 4.22–5.81)
RDW: 14.2 % (ref 11.5–15.5)
WBC: 6.9 10*3/uL (ref 4.0–10.5)

## 2015-04-03 LAB — COMPREHENSIVE METABOLIC PANEL
ALT: 16 U/L (ref 0–53)
AST: 14 U/L (ref 0–37)
Albumin: 4 g/dL (ref 3.5–5.2)
Alkaline Phosphatase: 93 U/L (ref 39–117)
BILIRUBIN TOTAL: 0.7 mg/dL (ref 0.2–1.2)
BUN: 8 mg/dL (ref 6–23)
CO2: 25 meq/L (ref 19–32)
CREATININE: 1.04 mg/dL (ref 0.50–1.35)
Calcium: 9.4 mg/dL (ref 8.4–10.5)
Chloride: 105 mEq/L (ref 96–112)
Glucose, Bld: 121 mg/dL — ABNORMAL HIGH (ref 70–99)
Potassium: 4.2 mEq/L (ref 3.5–5.3)
Sodium: 141 mEq/L (ref 135–145)
Total Protein: 7 g/dL (ref 6.0–8.3)

## 2015-04-03 LAB — LIPID PANEL
CHOL/HDL RATIO: 4.3 ratio
CHOLESTEROL: 121 mg/dL (ref 0–200)
HDL: 28 mg/dL — AB (ref >=40)
LDL Cholesterol: 62 mg/dL (ref 0–99)
TRIGLYCERIDES: 154 mg/dL — AB (ref <150)
VLDL: 31 mg/dL (ref 0–40)

## 2015-04-03 LAB — HEMOGLOBIN A1C
HEMOGLOBIN A1C: 6.9 % — AB (ref <5.7)
Mean Plasma Glucose: 151 mg/dL — ABNORMAL HIGH (ref <117)

## 2015-04-03 NOTE — Telephone Encounter (Signed)
Medication refilled per protocol. 

## 2015-04-07 ENCOUNTER — Encounter: Payer: Self-pay | Admitting: Family Medicine

## 2015-04-17 ENCOUNTER — Other Ambulatory Visit: Payer: Self-pay | Admitting: Family Medicine

## 2015-04-17 NOTE — Telephone Encounter (Signed)
Medication refilled per protocol. 

## 2015-04-30 ENCOUNTER — Other Ambulatory Visit: Payer: Self-pay | Admitting: Family Medicine

## 2015-04-30 NOTE — Telephone Encounter (Signed)
LRF 02/13/15 #60 + 3.  LOV 02/13/15  OK refill?  Too soon?

## 2015-05-01 NOTE — Telephone Encounter (Signed)
Refill denied too soon

## 2015-05-01 NOTE — Telephone Encounter (Signed)
This is too early he should have another month

## 2015-05-06 ENCOUNTER — Other Ambulatory Visit: Payer: Self-pay | Admitting: Family Medicine

## 2015-05-07 NOTE — Telephone Encounter (Signed)
Refill appropriate and filled per protocol. 

## 2015-05-08 ENCOUNTER — Other Ambulatory Visit: Payer: Self-pay | Admitting: Family Medicine

## 2015-05-08 NOTE — Telephone Encounter (Signed)
Medication refilled per protocol. 

## 2015-05-20 ENCOUNTER — Other Ambulatory Visit: Payer: Self-pay | Admitting: Family Medicine

## 2015-05-20 NOTE — Telephone Encounter (Signed)
LRF 02/13/15 #60 + 3  LOV 02/13/15  OK refill?

## 2015-05-20 NOTE — Telephone Encounter (Signed)
Okay to refill? 

## 2015-05-21 NOTE — Telephone Encounter (Signed)
Medication refilled per protocol. 

## 2015-06-01 ENCOUNTER — Other Ambulatory Visit: Payer: Self-pay | Admitting: Family Medicine

## 2015-06-01 NOTE — Telephone Encounter (Signed)
Refill appropriate and filled per protocol. 

## 2015-06-17 ENCOUNTER — Encounter: Payer: Self-pay | Admitting: Family Medicine

## 2015-06-17 ENCOUNTER — Ambulatory Visit (INDEPENDENT_AMBULATORY_CARE_PROVIDER_SITE_OTHER): Payer: BLUE CROSS/BLUE SHIELD | Admitting: Family Medicine

## 2015-06-17 VITALS — BP 124/66 | HR 74 | Temp 98.2°F | Resp 16 | Ht 73.0 in | Wt 214.0 lb

## 2015-06-17 DIAGNOSIS — Z8673 Personal history of transient ischemic attack (TIA), and cerebral infarction without residual deficits: Secondary | ICD-10-CM

## 2015-06-17 DIAGNOSIS — M4310 Spondylolisthesis, site unspecified: Secondary | ICD-10-CM | POA: Diagnosis not present

## 2015-06-17 DIAGNOSIS — I10 Essential (primary) hypertension: Secondary | ICD-10-CM

## 2015-06-17 DIAGNOSIS — E119 Type 2 diabetes mellitus without complications: Secondary | ICD-10-CM

## 2015-06-17 DIAGNOSIS — Z720 Tobacco use: Secondary | ICD-10-CM | POA: Diagnosis not present

## 2015-06-17 DIAGNOSIS — E785 Hyperlipidemia, unspecified: Secondary | ICD-10-CM

## 2015-06-17 NOTE — Assessment & Plan Note (Signed)
Discussed the importance of tobacco cessation this is increasing his risk of heart attack or another stroke

## 2015-06-17 NOTE — Progress Notes (Signed)
Patient ID: Aaron Medina, male   DOB: 04/26/1961, 54 y.o.   MRN: [id number]   Subjective:    Patient ID: Aaron Medina, male    DOB: [birth year], 54 y.o.   MRN: [id number]  Patient presents for 4 month F/U  patient here to follow chronic medical problems. He has no new concerns today. He continues to have chronic back pain also states that he feels like he continues to drag his left foot which has been a result from the stroke and he still feels weak. He lost his job but is still on his long-term disability is still trying to seek disability. He has been released from the neurology clinic. He is taking all of his medicines as prescribed and he tells me that his wife was  Fired from her last job and she recently started a new job therefore he will not have insurance for the next 60 days.  he states that his blood sugars have been good his last A1c was 6.9% 2 months ago. Denies any hypoglycemia no polyuria polydipsia   Review Of Systems:  GEN- denies fatigue, fever, weight loss,weakness, recent illness HEENT- denies eye drainage, change in vision, nasal discharge, CVS- denies chest pain, palpitations RESP- denies SOB, cough, wheeze ABD- denies N/V, change in stools, abd pain GU- denies dysuria, hematuria, dribbling, incontinence MSK- +joint pain, muscle aches, injury Neuro- denies headache, dizziness, syncope, seizure activity       Objective:    BP 124/66 mmHg  Pulse 74  Temp(Src) 98.2 F (36.8 C) (Oral)  Resp 16  Ht 6\' 1"  (1.854 m)  Wt 214 lb (97.07 kg)  BMI 28.24 kg/m2 GEN- NAD, alert and oriented x3, smells of tobacco/black n mild smoke HEENT- PERRL, EOMI, non injected sclera, pink conjunctiva, MMM, oropharynx clear Neck- Supple, no thyromegaly CVS- RRR, no murmur RESP-CTAB EXT- No edema Pulses- Radial, DP- 2+        Assessment & Plan:      Problem List Items Addressed This Visit    None      Note: This dictation was prepared with Dragon dictation along  with smaller phrase technology. Any transcriptional errors that result from this process are unintentional.

## 2015-06-17 NOTE — Assessment & Plan Note (Signed)
Blood pressure is well controlled medication medication 

## 2015-06-17 NOTE — Patient Instructions (Signed)
You need to quit smoking! Continue current medications F/U 3 months

## 2015-06-17 NOTE — Assessment & Plan Note (Addendum)
History of right cerebral artery stroke with left residual weakness  note I gave him 30 days worth of Xarelto so that he would not run out

## 2015-06-17 NOTE — Assessment & Plan Note (Signed)
Diabetes is fairly well controlled. Goals keep his risk factors under control. No change to metformin plan to recheck labs at his next visit he declined flu shot

## 2015-07-01 ENCOUNTER — Other Ambulatory Visit: Payer: Self-pay | Admitting: Family Medicine

## 2015-09-15 ENCOUNTER — Telehealth: Payer: Self-pay | Admitting: Orthopedic Surgery

## 2015-09-15 NOTE — Telephone Encounter (Signed)
Call received from patient/patient's wife, requesting appointment for hand fracture per Emergency Room visit at [facility]  Relayed protocol per Dr Aline Brochure regarding request for Emergency room physician records, Xray reports and films, prior to scheduling; also offered next available as Tuesday, 09/22/15; however, did not wish to move forward with requesting records and films at this time.

## 2015-09-16 ENCOUNTER — Encounter: Payer: Self-pay | Admitting: Family Medicine

## 2015-09-16 ENCOUNTER — Ambulatory Visit (INDEPENDENT_AMBULATORY_CARE_PROVIDER_SITE_OTHER): Payer: BLUE CROSS/BLUE SHIELD | Admitting: Family Medicine

## 2015-09-16 VITALS — BP 136/76 | HR 72 | Temp 98.0°F | Resp 18 | Ht 73.0 in | Wt 213.0 lb

## 2015-09-16 DIAGNOSIS — E785 Hyperlipidemia, unspecified: Secondary | ICD-10-CM

## 2015-09-16 DIAGNOSIS — E119 Type 2 diabetes mellitus without complications: Secondary | ICD-10-CM

## 2015-09-16 DIAGNOSIS — Z720 Tobacco use: Secondary | ICD-10-CM

## 2015-09-16 DIAGNOSIS — I10 Essential (primary) hypertension: Secondary | ICD-10-CM | POA: Diagnosis not present

## 2015-09-16 LAB — CBC WITH DIFFERENTIAL/PLATELET
BASOS PCT: 0 % (ref 0–1)
Basophils Absolute: 0 10*3/uL (ref 0.0–0.1)
Eosinophils Absolute: 0.1 10*3/uL (ref 0.0–0.7)
Eosinophils Relative: 1 % (ref 0–5)
HCT: 43.2 % (ref 39.0–52.0)
HEMOGLOBIN: 14.6 g/dL (ref 13.0–17.0)
Lymphocytes Relative: 54 % — ABNORMAL HIGH (ref 12–46)
Lymphs Abs: 3.3 10*3/uL (ref 0.7–4.0)
MCH: 28.7 pg (ref 26.0–34.0)
MCHC: 33.8 g/dL (ref 30.0–36.0)
MCV: 84.9 fL (ref 78.0–100.0)
MONOS PCT: 5 % (ref 3–12)
MPV: 9.2 fL (ref 8.6–12.4)
Monocytes Absolute: 0.3 10*3/uL (ref 0.1–1.0)
NEUTROS ABS: 2.5 10*3/uL (ref 1.7–7.7)
NEUTROS PCT: 40 % — AB (ref 43–77)
Platelets: 317 10*3/uL (ref 150–400)
RBC: 5.09 MIL/uL (ref 4.22–5.81)
RDW: 14.9 % (ref 11.5–15.5)
WBC: 6.2 10*3/uL (ref 4.0–10.5)

## 2015-09-16 LAB — COMPREHENSIVE METABOLIC PANEL
ALBUMIN: 4.3 g/dL (ref 3.6–5.1)
ALT: 11 U/L (ref 9–46)
AST: 13 U/L (ref 10–35)
Alkaline Phosphatase: 96 U/L (ref 40–115)
BUN: 10 mg/dL (ref 7–25)
CALCIUM: 9.3 mg/dL (ref 8.6–10.3)
CO2: 23 mmol/L (ref 20–31)
Chloride: 103 mmol/L (ref 98–110)
Creat: 1.08 mg/dL (ref 0.70–1.33)
Glucose, Bld: 119 mg/dL — ABNORMAL HIGH (ref 70–99)
Potassium: 4.1 mmol/L (ref 3.5–5.3)
Sodium: 137 mmol/L (ref 135–146)
Total Bilirubin: 0.3 mg/dL (ref 0.2–1.2)
Total Protein: 7.2 g/dL (ref 6.1–8.1)

## 2015-09-16 LAB — LIPID PANEL
CHOLESTEROL: 141 mg/dL (ref 125–200)
HDL: 25 mg/dL — ABNORMAL LOW (ref >=40)
LDL Cholesterol: 95 mg/dL (ref <130)
Total CHOL/HDL Ratio: 5.6 Ratio — ABNORMAL HIGH (ref <=5.0)
Triglycerides: 104 mg/dL (ref <150)
VLDL: 21 mg/dL (ref <30)

## 2015-09-16 MED ORDER — METOPROLOL SUCCINATE ER 50 MG PO TB24: 50.0000 mg | ORAL_TABLET | Freq: Every day | ORAL | Status: DC

## 2015-09-16 MED ORDER — LISINOPRIL 40 MG PO TABS: 40.0000 mg | ORAL_TABLET | Freq: Every day | ORAL | Status: DC

## 2015-09-16 MED ORDER — POLYETHYLENE GLYCOL 3350 17 GM/SCOOP PO POWD: 17.0000 g | Freq: Every day | ORAL | Status: DC

## 2015-09-16 MED ORDER — RIVAROXABAN 20 MG PO TABS: ORAL_TABLET | ORAL | Status: DC

## 2015-09-16 MED ORDER — METFORMIN HCL 850 MG PO TABS: 850.0000 mg | ORAL_TABLET | Freq: Every day | ORAL | Status: DC

## 2015-09-16 MED ORDER — ATORVASTATIN CALCIUM 10 MG PO TABS: ORAL_TABLET | ORAL | Status: DC

## 2015-09-16 MED ORDER — GLUCOSE BLOOD VI STRP: ORAL_STRIP | Status: DC

## 2015-09-16 MED ORDER — ONETOUCH DELICA LANCETS 33G MISC: Status: AC

## 2015-09-16 NOTE — Progress Notes (Signed)
Patient ID: Aaron Medina, male   DOB: 1961-09-29, 54 y.o.   MRN: [id number]   Subjective:    Patient ID: Aaron Medina, male    DOB: 1960/12/10, 54 y.o.   MRN: [id number]  Patient presents for 3 month F/U  patient here for follow-up on diabetes mellitus. He ha history of a stroke which he is currently on disability for.  -  His last A1c was 6.9%his LDL was at goal ut triglycerides were mildly elevated  CBG- he is not checking   Fell on hardwoods and broke his hand- has follow up with Dr. Lesly Dukes this evening , curerntly in splint   He missed a few weeks of his Toprol due to cost, he is now on wifes insurance    Review Of Systems:  GEN- denies fatigue, fever, weight loss,weakness, recent illness HEENT- denies eye drainage, change in vision, nasal discharge, CVS- denies chest pain, palpitations RESP- denies SOB, cough, wheeze ABD- denies N/V, change in stools, abd pain GU- denies dysuria, hematuria, dribbling, incontinence MSK- +joint pain, muscle aches, injury Neuro- denies headache, dizziness, syncope, seizure activity       Objective:    BP 136/76 mmHg  Pulse 72  Temp(Src) 98 F (36.7 C) (Oral)  Resp 18  Ht 6\' 1"  (1.854 m)  Wt 213 lb (96.616 kg)  BMI 28.11 kg/m2 GEN- NAD, alert and oriented x3,smells of tobacco HEENT- PERRL, EOMI, non injected sclera, pink conjunctiva, MMM, oropharynx clear CVS- RRR, no murmur RESP-CTAB MSK-right arm in soft splint EXT- No edema Pulses- Radial, DP- 2+        Assessment & Plan:      Problem List Items Addressed This Visit    None      Note: This dictation was prepared with Dragon dictation along with smaller phrase technology. Any transcriptional errors that result from this process are unintentional.

## 2015-09-16 NOTE — Assessment & Plan Note (Signed)
Recheck A1C, not chekcing CBG, Goal < 7% On statin

## 2015-09-16 NOTE — Assessment & Plan Note (Signed)
Well controlled, no change to meds 

## 2015-09-16 NOTE — Assessment & Plan Note (Signed)
counsled on tobacco cessation 

## 2015-09-16 NOTE — Patient Instructions (Signed)
Continue current medications F/U 4 months for Physical

## 2015-09-17 LAB — HEMOGLOBIN A1C
Hgb A1c MFr Bld: 6.9 % — ABNORMAL HIGH (ref <5.7)
MEAN PLASMA GLUCOSE: 151 mg/dL — AB (ref <117)

## 2015-09-18 ENCOUNTER — Other Ambulatory Visit: Payer: Self-pay | Admitting: *Deleted

## 2015-09-18 MED ORDER — BLOOD GLUCOSE MONITOR KIT: PACK | Status: AC

## 2015-09-18 MED ORDER — BLOOD GLUCOSE TEST VI STRP: ORAL_STRIP | Status: AC

## 2015-09-18 NOTE — Telephone Encounter (Signed)
Received fax requesting PA on One Touch Test Strips.   New prescription sent to pharmacy to change test strips to insurance preference.

## 2015-09-21 ENCOUNTER — Encounter: Payer: Self-pay | Admitting: *Deleted

## 2015-12-21 ENCOUNTER — Encounter: Payer: Self-pay | Admitting: Family Medicine

## 2016-01-18 ENCOUNTER — Encounter: Payer: BLUE CROSS/BLUE SHIELD | Admitting: Family Medicine

## 2016-02-10 ENCOUNTER — Telehealth: Payer: Self-pay | Admitting: *Deleted

## 2016-02-10 NOTE — Telephone Encounter (Signed)
Received fax from [facility] for long term disability forms. [place] [facility]- [phone] telephone/ [phone]~ fax.  Reason disability  requested: S/P CVA (01/10/2014), AFIB  Forms routed to provider.

## 2016-02-15 ENCOUNTER — Encounter: Payer: Self-pay | Admitting: Family Medicine

## 2016-02-15 ENCOUNTER — Ambulatory Visit (INDEPENDENT_AMBULATORY_CARE_PROVIDER_SITE_OTHER): Payer: BLUE CROSS/BLUE SHIELD | Admitting: Family Medicine

## 2016-02-15 VITALS — BP 128/68 | HR 72 | Temp 98.4°F | Resp 16 | Ht 73.0 in | Wt 216.0 lb

## 2016-02-15 DIAGNOSIS — E785 Hyperlipidemia, unspecified: Secondary | ICD-10-CM | POA: Diagnosis not present

## 2016-02-15 DIAGNOSIS — I480 Paroxysmal atrial fibrillation: Secondary | ICD-10-CM | POA: Diagnosis not present

## 2016-02-15 DIAGNOSIS — E119 Type 2 diabetes mellitus without complications: Secondary | ICD-10-CM

## 2016-02-15 DIAGNOSIS — I10 Essential (primary) hypertension: Secondary | ICD-10-CM

## 2016-02-15 DIAGNOSIS — Z720 Tobacco use: Secondary | ICD-10-CM | POA: Diagnosis not present

## 2016-02-15 DIAGNOSIS — Z8673 Personal history of transient ischemic attack (TIA), and cerebral infarction without residual deficits: Secondary | ICD-10-CM

## 2016-02-15 LAB — CBC WITH DIFFERENTIAL/PLATELET
BASOS ABS: 0 {cells}/uL (ref 0–200)
Basophils Relative: 0 %
EOS ABS: 74 {cells}/uL (ref 15–500)
Eosinophils Relative: 1 %
HEMATOCRIT: 44.9 % (ref 38.5–50.0)
Hemoglobin: 15 g/dL (ref 13.0–17.0)
LYMPHS PCT: 45 %
Lymphs Abs: 3330 cells/uL (ref 850–3900)
MCH: 29.2 pg (ref 27.0–33.0)
MCHC: 33.4 g/dL (ref 32.0–36.0)
MCV: 87.4 fL (ref 80.0–100.0)
MONO ABS: 518 {cells}/uL (ref 200–950)
MPV: 9 fL (ref 7.5–12.5)
Monocytes Relative: 7 %
NEUTROS PCT: 47 %
Neutro Abs: 3478 cells/uL (ref 1500–7800)
Platelets: 321 10*3/uL (ref 140–400)
RBC: 5.14 MIL/uL (ref 4.20–5.80)
RDW: 14.7 % (ref 11.0–15.0)
WBC: 7.4 10*3/uL (ref 3.8–10.8)

## 2016-02-15 LAB — HEMOGLOBIN A1C
Hgb A1c MFr Bld: 6.9 % — ABNORMAL HIGH (ref <5.7)
Mean Plasma Glucose: 151 mg/dL

## 2016-02-15 NOTE — Progress Notes (Signed)
Patient ID: Aaron Medina, male   DOB: 08/29/1961, 55 y.o.   MRN: [id number]    Subjective:    Patient ID: Aaron Medina, male    DOB: 16-Jul-1961, 55 y.o.   MRN: [id number]  Patient presents for F/U Patient for follow-up he's not been seen since December his history of stroke secondary to atrial fibrillation which he is currently on disability for I have paperwork with regards to his long-term disability. He also has diabetes mellitus and hypertension. Diabetes mellitus last A1c was 6.9% he is not checking CBG He is currently on metformin 850 mg twice a day as well as Lipitor 10 mg at bedtime he is on an ACE inhibitor and Toprol Regards to his stroke he had embolic stroke involving the right middle cerebral artery this resulted in some left hemiparesis which hecompleted physical therapy for. He is no longer following with neurology who released him back in the fall 2016. He is able to do some regular activities around the house including mowing grass and some light housework. He does have chronic back pain as he has disc disease and spondylolisthesis however he does not want any intervention for this at this time. He still feels like his balance is off that time since he had the stroke.     Review Of Systems:  GEN- denies fatigue, fever, weight loss,weakness, recent illness HEENT- denies eye drainage, change in vision, nasal discharge, CVS- denies chest pain, palpitations RESP- denies SOB, cough, wheeze ABD- denies N/V, change in stools, abd pain GU- denies dysuria, hematuria, dribbling, incontinence MSK- + joint pain, muscle aches, injury Neuro- denies headache, dizziness, syncope, seizure activity       Objective:    BP 128/68 mmHg  Pulse 72  Temp(Src) 98.4 F (36.9 C) (Oral)  Resp 16  Ht 6\' 1"  (1.854 m)  Wt 216 lb (97.977 kg)  BMI 28.50 kg/m2 GEN- NAD, alert and oriented x3 HEENT- PERRL, EOMI, non injected sclera, pink conjunctiva, MMM, oropharynx clear Neck-  Supple, good ROM CVS- RRR, no murmur RESP-CTAB Neuro- normal grasp bilat,strength 5/5 upper ext and lower ext, no tremor, mild gait abnormality left side, normal tone LE, sensation in tact EXT- No edema Pulses- Radial, DP- 2+        Assessment & Plan:      Problem List Items Addressed This Visit    Type 2 diabetes mellitus without complication (HCC)    Check A1C, he needs good control, to prevent recurrence of CVA       Relevant Orders   CBC with Differential/Platelet (Completed)   Comprehensive metabolic panel (Completed)   Hemoglobin A1c (Completed)   HM DIABETES FOOT EXAM (Completed)   Tobacco use   PAF (paroxysmal atrial fibrillation) (HCC)    NSR, on anti-coagulation      Hyperlipidemia   Relevant Orders   Lipid panel (Completed)   History of CVA (cerebrovascular accident) - Primary    He is able to do many things around the house, will he does have concurrent back issues, and still some balance issues, I think he could be released to return to some type of work. He would have restrictions on heavy lifting, pulling pushing. 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       Essential hypertension, benign    Controlled,no change to meds      Relevant Orders   CBC with Differential/Platelet (Completed)      Note: This  dictation was prepared with Dragon dictation along with smaller phrase technology. Any transcriptional errors that result from this process are unintentional.

## 2016-02-15 NOTE — Patient Instructions (Signed)
Take Tylenol for headaches  Continue current medication We will call with lab results  F/U 4 months

## 2016-02-16 ENCOUNTER — Encounter: Payer: Self-pay | Admitting: *Deleted

## 2016-02-16 ENCOUNTER — Encounter: Payer: Self-pay | Admitting: Family Medicine

## 2016-02-16 LAB — COMPREHENSIVE METABOLIC PANEL
ALK PHOS: 85 U/L (ref 40–115)
ALT: 18 U/L (ref 9–46)
AST: 15 U/L (ref 10–35)
Albumin: 4.1 g/dL (ref 3.6–5.1)
BUN: 10 mg/dL (ref 7–25)
CALCIUM: 9.3 mg/dL (ref 8.6–10.3)
CO2: 27 mmol/L (ref 20–31)
Chloride: 104 mmol/L (ref 98–110)
Creat: 1.08 mg/dL (ref 0.70–1.33)
GLUCOSE: 135 mg/dL — AB (ref 70–99)
POTASSIUM: 4 mmol/L (ref 3.5–5.3)
Sodium: 140 mmol/L (ref 135–146)
Total Bilirubin: 0.4 mg/dL (ref 0.2–1.2)
Total Protein: 7.4 g/dL (ref 6.1–8.1)

## 2016-02-16 LAB — LIPID PANEL
CHOLESTEROL: 114 mg/dL — AB (ref 125–200)
HDL: 30 mg/dL — ABNORMAL LOW (ref >=40)
LDL Cholesterol: 62 mg/dL (ref <130)
TRIGLYCERIDES: 109 mg/dL (ref <150)
Total CHOL/HDL Ratio: 3.8 Ratio (ref <=5.0)
VLDL: 22 mg/dL (ref <30)

## 2016-02-16 NOTE — Assessment & Plan Note (Signed)
He is able to do many things around the house, will he does have concurrent back issues, and still some balance issues, I think he could be released to return to some type of work. He would have restrictions on heavy lifting, pulling pushing. 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

## 2016-02-16 NOTE — Assessment & Plan Note (Signed)
Check A1C, he needs good control, to prevent recurrence of CVA

## 2016-02-16 NOTE — Assessment & Plan Note (Signed)
NSR, on anti-coagulation

## 2016-02-16 NOTE — Assessment & Plan Note (Signed)
Controlled, no change to meds 

## 2016-02-16 NOTE — Progress Notes (Signed)
Foot Exam- long nails bilat, some curve  Mild callus,  Normal monofilament right foot Decreased Left great toe

## 2016-02-17 NOTE — Telephone Encounter (Addendum)
Received completed forms from provider.   No charge per provider.   Faxed completed forms to [facility].   Call placed to patient and patient made aware.

## 2016-03-29 ENCOUNTER — Encounter: Payer: Self-pay | Admitting: Family Medicine

## 2016-04-03 ENCOUNTER — Other Ambulatory Visit: Payer: Self-pay | Admitting: Family Medicine

## 2016-04-04 NOTE — Telephone Encounter (Signed)
Refill appropriate and filled per protocol. 

## 2016-05-05 ENCOUNTER — Other Ambulatory Visit: Payer: Self-pay | Admitting: Family Medicine

## 2016-05-05 NOTE — Telephone Encounter (Signed)
Refill appropriate and filled per protocol. 

## 2016-06-22 ENCOUNTER — Ambulatory Visit: Payer: BLUE CROSS/BLUE SHIELD | Admitting: Family Medicine

## 2016-07-19 ENCOUNTER — Ambulatory Visit: Payer: BLUE CROSS/BLUE SHIELD | Admitting: Family Medicine

## 2016-07-22 ENCOUNTER — Ambulatory Visit: Payer: BLUE CROSS/BLUE SHIELD | Admitting: Family Medicine

## 2016-08-02 ENCOUNTER — Encounter: Payer: Self-pay | Admitting: Family Medicine

## 2016-08-02 ENCOUNTER — Ambulatory Visit (INDEPENDENT_AMBULATORY_CARE_PROVIDER_SITE_OTHER): Payer: BLUE CROSS/BLUE SHIELD | Admitting: Family Medicine

## 2016-08-02 VITALS — BP 152/98 | HR 84 | Temp 98.4°F | Resp 14 | Ht 73.0 in | Wt 214.0 lb

## 2016-08-02 DIAGNOSIS — Z720 Tobacco use: Secondary | ICD-10-CM | POA: Diagnosis not present

## 2016-08-02 DIAGNOSIS — I10 Essential (primary) hypertension: Secondary | ICD-10-CM | POA: Diagnosis not present

## 2016-08-02 DIAGNOSIS — E7800 Pure hypercholesterolemia, unspecified: Secondary | ICD-10-CM

## 2016-08-02 DIAGNOSIS — E119 Type 2 diabetes mellitus without complications: Secondary | ICD-10-CM | POA: Diagnosis not present

## 2016-08-02 DIAGNOSIS — M4316 Spondylolisthesis, lumbar region: Secondary | ICD-10-CM | POA: Diagnosis not present

## 2016-08-02 LAB — LIPID PANEL
Cholesterol: 109 mg/dL — ABNORMAL LOW (ref 125–200)
HDL: 32 mg/dL — AB (ref >=40)
LDL CALC: 62 mg/dL (ref <130)
Total CHOL/HDL Ratio: 3.4 Ratio (ref <=5.0)
Triglycerides: 77 mg/dL (ref <150)
VLDL: 15 mg/dL (ref <30)

## 2016-08-02 LAB — CBC WITH DIFFERENTIAL/PLATELET
Basophils Absolute: 0 cells/uL (ref 0–200)
Basophils Relative: 0 %
EOS PCT: 2 %
Eosinophils Absolute: 160 cells/uL (ref 15–500)
HCT: 43 % (ref 38.5–50.0)
Hemoglobin: 14.3 g/dL (ref 13.0–17.0)
LYMPHS PCT: 46 %
Lymphs Abs: 3680 cells/uL (ref 850–3900)
MCH: 28.8 pg (ref 27.0–33.0)
MCHC: 33.3 g/dL (ref 32.0–36.0)
MCV: 86.7 fL (ref 80.0–100.0)
MONOS PCT: 7 %
MPV: 9.7 fL (ref 7.5–12.5)
Monocytes Absolute: 560 cells/uL (ref 200–950)
NEUTROS PCT: 45 %
Neutro Abs: 3600 cells/uL (ref 1500–7800)
Platelets: 296 10*3/uL (ref 140–400)
RBC: 4.96 MIL/uL (ref 4.20–5.80)
RDW: 14.3 % (ref 11.0–15.0)
WBC: 8 10*3/uL (ref 3.8–10.8)

## 2016-08-02 LAB — COMPREHENSIVE METABOLIC PANEL
ALT: 15 U/L (ref 9–46)
AST: 15 U/L (ref 10–35)
Albumin: 4.1 g/dL (ref 3.6–5.1)
Alkaline Phosphatase: 83 U/L (ref 40–115)
BILIRUBIN TOTAL: 0.3 mg/dL (ref 0.2–1.2)
BUN: 10 mg/dL (ref 7–25)
CHLORIDE: 105 mmol/L (ref 98–110)
CO2: 26 mmol/L (ref 20–31)
CREATININE: 1.13 mg/dL (ref 0.70–1.33)
Calcium: 9.4 mg/dL (ref 8.6–10.3)
GLUCOSE: 109 mg/dL — AB (ref 70–99)
Potassium: 4.3 mmol/L (ref 3.5–5.3)
SODIUM: 139 mmol/L (ref 135–146)
Total Protein: 6.9 g/dL (ref 6.1–8.1)

## 2016-08-02 MED ORDER — TRAMADOL HCL 50 MG PO TABS: 50.0000 mg | ORAL_TABLET | Freq: Three times a day (TID) | ORAL | 1 refills | Status: DC | PRN

## 2016-08-02 MED ORDER — LISINOPRIL 40 MG PO TABS: 40.0000 mg | ORAL_TABLET | Freq: Every day | ORAL | 6 refills | Status: DC

## 2016-08-02 MED ORDER — METOPROLOL SUCCINATE ER 50 MG PO TB24: 50.0000 mg | ORAL_TABLET | Freq: Every day | ORAL | 6 refills | Status: DC

## 2016-08-02 NOTE — Assessment & Plan Note (Signed)
Tramadol refilled.

## 2016-08-02 NOTE — Patient Instructions (Addendum)
Continue current medications Flu shot Cardiology follow I will check on F/U 4 months for Physical

## 2016-08-02 NOTE — Assessment & Plan Note (Signed)
Blood pressure is typically controlled no changes medication continue to monitor at home

## 2016-08-02 NOTE — Assessment & Plan Note (Signed)
Counseled on tobacco cessation 

## 2016-08-02 NOTE — Progress Notes (Signed)
Subjective:    Patient ID: Aaron Medina, male    DOB: 1961/05/27, 55 y.o.   MRN: [id number]  Patient presents for Follow-up (is fasting)  Patient here in follow-up chronic medical problems. Medications reviewed History of CVA -released from neurology he states he continues to have difficulty with chronic back pain he was seen by neurosurgery for this physical therapy was recommended at that time that surgery was discussed for his spondylolisthesis. He continues to have difficulty standing for long period of time or walking for long. The time. States that he did try applying for some jobs but was turned down due to his health conditions. He is awaiting his hearing  for disability next month   Diabetes mellitus currently on metformin 850 mg twice a day his last A1c 6.9%. He is on ACE inhibitor and statin drug his LDL was 62 back in May due for repeat fasting labs today. He does not check his blood sugars at home denies any hypoglycemia symptoms He has not had any follow-up from cardiology is history of paroxysmal atrial fibrillation he is on Xarelto. At this time unable to afford doctor visit    Declines flu shot    Review Of Systems:  GEN- denies fatigue, fever, weight loss,weakness, recent illness HEENT- denies eye drainage, change in vision, nasal discharge, CVS- denies chest pain, palpitations RESP- denies SOB, cough, wheeze ABD- denies N/V, change in stools, abd pain GU- denies dysuria, hematuria, dribbling, incontinence MSK- +joint pain, muscle aches, injury Neuro- denies headache, dizziness, syncope, seizure activity       Objective:    BP (!) 152/98 (BP Location: Left Arm, Patient Position: Sitting, Cuff Size: Large)   Pulse 84   Temp 98.4 F (36.9 C) (Oral)   Resp 14   Ht 6\' 1"  (1.854 m)   Wt 214 lb (97.1 kg)   BMI 28.23 kg/m  GEN- NAD, alert and oriented x3 HEENT- PERRL, EOMI, non injected sclera, pink conjunctiva, MMM, oropharynx clear Neck- Supple, no  bruit CVS- RRR, no murmur RESP-CTAB EXT- No edema, antalgic gait Pulses- Radial 2+        Assessment & Plan:      No specific cardiology follow is needed at this time  Problem List Items Addressed This Visit    Type 2 diabetes mellitus without complication (HCC) - Primary    Recheck A1c as well as cholesterol levels. He did not take his medications until just before the visit his blood pressure has been well-controlled. No changes to his lisinopril or metoprolol. Goal is A1c less than 7%      Relevant Medications   lisinopril (PRINIVIL,ZESTRIL) 40 MG tablet   Other Relevant Orders   CBC with Differential/Platelet   Comprehensive metabolic panel   Hemoglobin A1c   Tobacco use    Counseled on tobacco cessation      Spondylisthesis    Tramadol refilled      Hyperlipidemia   Relevant Medications   lisinopril (PRINIVIL,ZESTRIL) 40 MG tablet   metoprolol succinate (TOPROL-XL) 50 MG 24 hr tablet   Other Relevant Orders   Lipid panel   Essential hypertension, benign    Blood pressure is typically controlled no changes medication continue to monitor at home      Relevant Medications   lisinopril (PRINIVIL,ZESTRIL) 40 MG tablet   metoprolol succinate (TOPROL-XL) 50 MG 24 hr tablet    Other Visit Diagnoses   None.     Note: This dictation was prepared with Dragon dictation along  with smaller phrase technology. Any transcriptional errors that result from this process are unintentional.

## 2016-08-02 NOTE — Assessment & Plan Note (Addendum)
Recheck A1c as well as cholesterol levels. He did not take his medications until just before the visit his blood pressure has been well-controlled. No changes to his lisinopril or metoprolol. Goal is A1c less than 7%

## 2016-08-03 LAB — HEMOGLOBIN A1C
HEMOGLOBIN A1C: 6.1 % — AB (ref <5.7)
Mean Plasma Glucose: 128 mg/dL

## 2016-08-11 ENCOUNTER — Encounter: Payer: Self-pay | Admitting: *Deleted

## 2016-08-12 ENCOUNTER — Other Ambulatory Visit: Payer: Self-pay | Admitting: Family Medicine

## 2016-08-31 ENCOUNTER — Other Ambulatory Visit: Payer: Self-pay | Admitting: *Deleted

## 2016-08-31 MED ORDER — LISINOPRIL 40 MG PO TABS: 40.0000 mg | ORAL_TABLET | Freq: Every day | ORAL | 3 refills | Status: AC

## 2016-08-31 NOTE — Telephone Encounter (Signed)
Received fax requesting refill on Lisinopril.   Refill appropriate and filled per protocol. 

## 2016-11-18 ENCOUNTER — Other Ambulatory Visit: Payer: Self-pay | Admitting: Family Medicine

## 2016-12-05 ENCOUNTER — Encounter: Payer: BLUE CROSS/BLUE SHIELD | Admitting: Family Medicine

## 2016-12-05 NOTE — Progress Notes (Deleted)
   Subjective:    Patient ID: Aaron Medina, male    DOB: 12-22-1960, 56 y.o.   MRN: [id number]  Patient presents for No chief complaint on file. Patient here for complete physical exam. His medications are reviewed. He is due for colonoscopy Immunizations due for tetanus booster as well as influenza Discussed hepatitis C screening Diabetes mellitus his last A1c 6.1% in October 2017 his LDL was at goal less than 70, he is due for eye exam, his CBGs range  Is currently on metformin 850 mg twice a day along with ACE inhibitor and he is also on Xarelto  secondary to history of stroke    Review Of Systems:  GEN- denies fatigue, fever, weight loss,weakness, recent illness HEENT- denies eye drainage, change in vision, nasal discharge, CVS- denies chest pain, palpitations RESP- denies SOB, cough, wheeze ABD- denies N/V, change in stools, abd pain GU- denies dysuria, hematuria, dribbling, incontinence MSK- denies joint pain, muscle aches, injury Neuro- denies headache, dizziness, syncope, seizure activity       Objective:    There were no vitals taken for this visit. GEN- NAD, alert and oriented x3 HEENT- PERRL, EOMI, non injected sclera, pink conjunctiva, MMM, oropharynx clear Neck- Supple, no thyromegaly CVS- RRR, no murmur RESP-CTAB ABD-NABS,soft,NT,ND EXT- No edema Pulses- Radial, DP- 2+        Assessment & Plan:      Problem List Items Addressed This Visit    None      Note: This dictation was prepared with Dragon dictation along with smaller phrase technology. Any transcriptional errors that result from this process are unintentional.

## 2016-12-15 ENCOUNTER — Other Ambulatory Visit: Payer: Self-pay | Admitting: Family Medicine

## 2016-12-28 ENCOUNTER — Ambulatory Visit (INDEPENDENT_AMBULATORY_CARE_PROVIDER_SITE_OTHER): Payer: BLUE CROSS/BLUE SHIELD | Admitting: Family Medicine

## 2016-12-28 ENCOUNTER — Encounter: Payer: Self-pay | Admitting: Family Medicine

## 2016-12-28 ENCOUNTER — Encounter: Payer: Self-pay | Admitting: *Deleted

## 2016-12-28 VITALS — BP 158/98 | HR 82 | Temp 98.8°F | Resp 14 | Ht 73.0 in | Wt 216.0 lb

## 2016-12-28 DIAGNOSIS — Z125 Encounter for screening for malignant neoplasm of prostate: Secondary | ICD-10-CM

## 2016-12-28 DIAGNOSIS — R2689 Other abnormalities of gait and mobility: Secondary | ICD-10-CM

## 2016-12-28 DIAGNOSIS — R202 Paresthesia of skin: Secondary | ICD-10-CM

## 2016-12-28 DIAGNOSIS — N529 Male erectile dysfunction, unspecified: Secondary | ICD-10-CM

## 2016-12-28 DIAGNOSIS — I10 Essential (primary) hypertension: Secondary | ICD-10-CM | POA: Diagnosis not present

## 2016-12-28 DIAGNOSIS — Z8673 Personal history of transient ischemic attack (TIA), and cerebral infarction without residual deficits: Secondary | ICD-10-CM | POA: Diagnosis not present

## 2016-12-28 DIAGNOSIS — E119 Type 2 diabetes mellitus without complications: Secondary | ICD-10-CM | POA: Diagnosis not present

## 2016-12-28 DIAGNOSIS — E7800 Pure hypercholesterolemia, unspecified: Secondary | ICD-10-CM | POA: Diagnosis not present

## 2016-12-28 DIAGNOSIS — M4316 Spondylolisthesis, lumbar region: Secondary | ICD-10-CM

## 2016-12-28 DIAGNOSIS — Z0000 Encounter for general adult medical examination without abnormal findings: Secondary | ICD-10-CM

## 2016-12-28 LAB — LIPID PANEL
CHOLESTEROL: 112 mg/dL (ref <200)
HDL: 35 mg/dL — ABNORMAL LOW (ref >40)
LDL Cholesterol: 61 mg/dL (ref <100)
TRIGLYCERIDES: 78 mg/dL (ref <150)
Total CHOL/HDL Ratio: 3.2 Ratio (ref <5.0)
VLDL: 16 mg/dL (ref <30)

## 2016-12-28 LAB — COMPLETE METABOLIC PANEL WITH GFR
ALK PHOS: 83 U/L (ref 40–115)
ALT: 26 U/L (ref 9–46)
AST: 21 U/L (ref 10–35)
Albumin: 4.2 g/dL (ref 3.6–5.1)
BILIRUBIN TOTAL: 0.4 mg/dL (ref 0.2–1.2)
BUN: 9 mg/dL (ref 7–25)
CHLORIDE: 107 mmol/L (ref 98–110)
CO2: 23 mmol/L (ref 20–31)
CREATININE: 1.04 mg/dL (ref 0.70–1.33)
Calcium: 9.2 mg/dL (ref 8.6–10.3)
GFR, Est Non African American: 80 mL/min (ref >=60)
Glucose, Bld: 121 mg/dL — ABNORMAL HIGH (ref 70–99)
Potassium: 4.2 mmol/L (ref 3.5–5.3)
Sodium: 142 mmol/L (ref 135–146)
TOTAL PROTEIN: 7.1 g/dL (ref 6.1–8.1)

## 2016-12-28 LAB — CBC WITH DIFFERENTIAL/PLATELET
BASOS PCT: 0 %
Basophils Absolute: 0 cells/uL (ref 0–200)
EOS PCT: 1 %
Eosinophils Absolute: 76 cells/uL (ref 15–500)
HEMATOCRIT: 44.1 % (ref 38.5–50.0)
HEMOGLOBIN: 14.7 g/dL (ref 13.0–17.0)
LYMPHS ABS: 3192 {cells}/uL (ref 850–3900)
Lymphocytes Relative: 42 %
MCH: 29.5 pg (ref 27.0–33.0)
MCHC: 33.3 g/dL (ref 32.0–36.0)
MCV: 88.4 fL (ref 80.0–100.0)
MONO ABS: 456 {cells}/uL (ref 200–950)
MPV: 9.6 fL (ref 7.5–12.5)
Monocytes Relative: 6 %
Neutro Abs: 3876 cells/uL (ref 1500–7800)
Neutrophils Relative %: 51 %
Platelets: 282 10*3/uL (ref 140–400)
RBC: 4.99 MIL/uL (ref 4.20–5.80)
RDW: 14.6 % (ref 11.0–15.0)
WBC: 7.6 10*3/uL (ref 3.8–10.8)

## 2016-12-28 LAB — PSA: PSA: 1.1 ng/mL (ref <=4.0)

## 2016-12-28 MED ORDER — TRAMADOL HCL 50 MG PO TABS: 50.0000 mg | ORAL_TABLET | Freq: Three times a day (TID) | ORAL | 2 refills | Status: AC | PRN

## 2016-12-28 NOTE — Patient Instructions (Addendum)
Referral to Dr. Kristeen Mans MRI of brain to be done  Try the melatonin for sleep F/U 4 months

## 2016-12-28 NOTE — Assessment & Plan Note (Signed)
This is a chronic issue he has worsening symptoms with standing and activities. I've refilled his tramadol recommend he go back to see his neurosurgeon of his chronic back pain is a part of his disability hearing. I think that he needs updated information was from a specialist with regards to his impairment.

## 2016-12-28 NOTE — Assessment & Plan Note (Signed)
Typically well controlled. We'll check his labs today including his cholesterol. No change this medication unless his A1c is above 7%

## 2016-12-28 NOTE — Progress Notes (Signed)
Subjective:    Patient ID: Aaron Medina, male    DOB: 11-16-1960, 56 y.o.   MRN: [id number]  Patient presents for CPE (is fasting); Lower Back Pain (states that he has R sided lower back pain that radiates down R leg- states that he can't stand for extended periods of time); and Insomnia (x months- has difficulty getting to sleep and staying asleep)   Pt here for CPE   Complians of his chronic back pain, has had MRI has multiple disc bulges, syncovial cyst, some narrowing affecting the nerve roots.  Has seen [facility] in 2016Who at that time discuss surgery with him. At that time he did not have any intervention as he recently had the stroke was on blood thinners. He does take tramadol as needed for pain and is out of this medication. He is interested in going back to the surgeon as his back pain is worse and he has a lot of pain down his right leg has weakness and states that he cannot stay in for greater than 30 minutes without severe pain.   Chronic insomnia -difficulty falling asleep, denies napping during 2 oclock in the morning , feels like memory has worsened and balance is worse as he is history of stroke with some left-sided fine motor problems. He has wife are concerned that he may been having some other mini strokes sometimes he just appears that he gets weak spells and his balance is been progressively worse. He also feels like his feeling in his left hand has decreased. This is all happened over the past few months. Of note he does have a disability hearing and may    DM- Last A1C 6.1% on Metformin , he does not check his blood sugars but states he takes all his medications.  Due for colonscopy  I exam performed last year by my eye doctor Immunizations- UTD,  Due for PSA and Hep C screening       Review Of Systems:  GEN- denies fatigue, fever, weight loss,weakness, recent illness HEENT- denies eye drainage, change in vision, nasal discharge, CVS- denies  chest pain, palpitations RESP- denies SOB, cough, wheeze ABD- denies N/V, change in stools, abd pain GU- denies dysuria, hematuria, dribbling, incontinence MSK- +joint pain, muscle aches, injury Neuro- denies headache, dizziness, syncope, seizure activity       Objective:    BP (!) 158/98   Pulse 82   Temp 98.8 F (37.1 C) (Oral)   Resp 14   Ht 6\' 1"  (1.854 m)   Wt 216 lb (98 kg)   SpO2 97%   BMI 28.50 kg/m  GEN- NAD, alert and oriented x3 HEENT- PERRL, EOMI, non injected sclera, pink conjunctiva, MMM, oropharynx clear Neck- Supple, no thyromegaly CVS- RRR, no murmur RESP-CTAB ABD-NABS,soft,NT,ND Rectum- normal Tone, FOBT negative, prostate smooth  Neuro-CNII-XII grossly in tact, antalgic gait,normal speech, took time for word finding, decreased sensation in left hand compared to right  EXT- No edema Pulses- Radial, DP- 2+        Assessment & Plan:      Problem List Items Addressed This Visit    Type 2 diabetes mellitus without complication ([place])    Typically well controlled. We'll check his labs today including his cholesterol. No change this medication unless his A1c is above 7%      Relevant Orders   COMPLETE METABOLIC PANEL WITH GFR   Lipid panel   Hemoglobin A1c   CBC with Differential/Platelet   Spondylisthesis  This is a chronic issue he has worsening symptoms with standing and activities. I've refilled his tramadol recommend he go back to see his neurosurgeon of his chronic back pain is a part of his disability hearing. I think that he needs updated information was from a specialist with regards to his impairment.      Relevant Orders   COMPLETE METABOLIC PANEL WITH GFR   Lipid panel   Hemoglobin A1c   CBC with Differential/Platelet   Ambulatory referral to Neurosurgery   Hyperlipidemia   Relevant Orders   COMPLETE METABOLIC PANEL WITH GFR   Lipid panel   Hemoglobin A1c   CBC with Differential/Platelet   History of CVA (cerebrovascular  accident)    He has history of embolic stroke however now with progressive gait problem per patient in some worsening loss of sensation in his left hand. I will obtain an MRI of his brain to see if he has had any other  events      Relevant Orders   MR Brain Wo Contrast   Essential hypertension, benign    No medications this morning as he is fasting.      Relevant Orders   COMPLETE METABOLIC PANEL WITH GFR   Lipid panel   Hemoglobin A1c   CBC with Differential/Platelet    Other Visit Diagnoses    Routine general medical examination at a health care facility    -  Primary   CPE done, Hep C screen, refer for colonoscopy, immunizations UTD, PSA screen   Relevant Orders   COMPLETE METABOLIC PANEL WITH GFR   Lipid panel   Hemoglobin A1c   CBC with Differential/Platelet   Screening PSA (prostate specific antigen)       Relevant Orders   COMPLETE METABOLIC PANEL WITH GFR   Lipid panel   Hemoglobin A1c   CBC with Differential/Platelet   PSA   Erectile dysfunction, unspecified erectile dysfunction type       at end of visit stated difficulty with ejaculating, no urianary symptoms, check testosterone   Relevant Orders   Testosterone   Imbalance       Relevant Orders   MR Brain Wo Contrast   Paresthesia of arm       Relevant Orders   MR Brain Wo Contrast      Note: This dictation was prepared with Dragon dictation along with smaller phrase technology. Any transcriptional errors that result from this process are unintentional.

## 2016-12-28 NOTE — Assessment & Plan Note (Signed)
No medications this morning as he is fasting.

## 2016-12-28 NOTE — Assessment & Plan Note (Signed)
He has history of embolic stroke however now with progressive gait problem per patient in some worsening loss of sensation in his left hand. I will obtain an MRI of his brain to see if he has had any other  events

## 2016-12-29 LAB — HEMOGLOBIN A1C
Hgb A1c MFr Bld: 6.1 % — ABNORMAL HIGH (ref <5.7)
MEAN PLASMA GLUCOSE: 128 mg/dL

## 2016-12-29 LAB — TESTOSTERONE: TESTOSTERONE: 573 ng/dL (ref 250–827)

## 2016-12-30 ENCOUNTER — Telehealth: Payer: Self-pay | Admitting: *Deleted

## 2016-12-30 NOTE — Telephone Encounter (Signed)
Received request from pharmacy for PA on Tramadol.   PA submitted.   Dx: M19.90- OA

## 2017-01-02 NOTE — Telephone Encounter (Signed)
Received PA determination.   PA approved 12/03/2016- 01/02/2018.  Pharmacy made aware.

## 2017-01-04 ENCOUNTER — Ambulatory Visit (HOSPITAL_COMMUNITY)
Admission: RE | Admit: 2017-01-04 | Discharge: 2017-01-04 | Disposition: A | Discharge status: Home / Self Care | Payer: BLUE CROSS/BLUE SHIELD | Source: Ambulatory Visit | Attending: Family Medicine | Admitting: Family Medicine

## 2017-01-04 DIAGNOSIS — R202 Paresthesia of skin: Secondary | ICD-10-CM | POA: Insufficient documentation

## 2017-01-04 DIAGNOSIS — I63511 Cerebral infarction due to unspecified occlusion or stenosis of right middle cerebral artery: Secondary | ICD-10-CM | POA: Insufficient documentation

## 2017-01-04 DIAGNOSIS — R2689 Other abnormalities of gait and mobility: Secondary | ICD-10-CM | POA: Diagnosis not present

## 2017-01-04 DIAGNOSIS — Z8673 Personal history of transient ischemic attack (TIA), and cerebral infarction without residual deficits: Secondary | ICD-10-CM | POA: Diagnosis present

## 2017-02-09 ENCOUNTER — Encounter: Payer: Self-pay | Admitting: Family Medicine

## 2017-04-17 ENCOUNTER — Other Ambulatory Visit: Payer: Self-pay | Admitting: Family Medicine

## 2017-05-01 ENCOUNTER — Ambulatory Visit: Payer: BLUE CROSS/BLUE SHIELD | Admitting: Family Medicine

## 2017-06-30 ENCOUNTER — Other Ambulatory Visit: Payer: Self-pay | Admitting: Family Medicine

## 2017-06-30 NOTE — Telephone Encounter (Signed)
Patient has been discharged from practice due to past due balance.

## 2017-07-03 ENCOUNTER — Telehealth: Payer: Self-pay | Admitting: Family Medicine

## 2017-07-03 NOTE — Telephone Encounter (Signed)
Pt needs refill on atorvastatin and xarelto, [facility] [place].  I notified pt of outstanding account balance, and that he had been discharged, she still wanted me to put message. Please call her back.

## 2017-07-04 NOTE — Telephone Encounter (Addendum)
Patient discharged from practice d/t outstanding balance. E Care completed on 03/16/2017.  Call placed to patient and patient wife Levada Dy made aware that patient has been discharged and no further refills will be given.   Patient wife states that she has been only paycheck in household >4 years since patient stroke. Advised that our office has attempted to contact patient and wife to set up payment plan, even if it was only $5 per month. States that she has other obligations and hopes patient does not have another stroke. Advised to contact cardiology, as they may fill Xarelto.   Patient wife states that she is thoroughly outraged with our office and will let everyone know how sorry of a doctor's office we are. Advised that I was going to disconnect call if there were no other issues I could assist with. Patient wife continued to say she was going to let everyone know. Call disconnected.

## 2017-07-15 ENCOUNTER — Other Ambulatory Visit: Payer: Self-pay | Admitting: Family Medicine

## 2017-07-17 ENCOUNTER — Other Ambulatory Visit: Payer: Self-pay | Admitting: Family Medicine

## 2017-07-31 ENCOUNTER — Other Ambulatory Visit: Payer: Self-pay | Admitting: Family Medicine

## 2017-09-07 ENCOUNTER — Other Ambulatory Visit: Payer: Self-pay | Admitting: Family Medicine

## 2017-10-11 DIAGNOSIS — M545 Low back pain: Secondary | ICD-10-CM | POA: Diagnosis not present

## 2017-10-27 ENCOUNTER — Ambulatory Visit (INDEPENDENT_AMBULATORY_CARE_PROVIDER_SITE_OTHER): Payer: BLUE CROSS/BLUE SHIELD | Admitting: Neurology

## 2017-10-27 ENCOUNTER — Encounter: Payer: BLUE CROSS/BLUE SHIELD | Admitting: Neurology

## 2017-10-27 ENCOUNTER — Encounter (INDEPENDENT_AMBULATORY_CARE_PROVIDER_SITE_OTHER): Payer: Self-pay

## 2017-10-27 DIAGNOSIS — M545 Low back pain: Secondary | ICD-10-CM | POA: Diagnosis not present

## 2017-10-27 DIAGNOSIS — Z0289 Encounter for other administrative examinations: Secondary | ICD-10-CM

## 2017-10-27 NOTE — Procedures (Signed)
Full Name: Aaron Medina Gender: Male MRN #: [id number] Date of Birth: 11-Dec-1960    Visit Date: 10/27/2017 09:07 Age: 57 Years 74 Months Old Examining Physician: Marcial Pacas, MD  Referring Physician: Rip Harbour, MD History: 57 year old male, with history of chronic low back pain, radiating pain to the right leg  Summary of the tests:  Nerve conduction study: Right sural, superficial peroneal sensory responses were normal.  Right peroneal to EDB motor responses were normal.  Right tibial motor responses showed mildly prolonged distal latency, slow conduction velocity, this could due to his cold limb temperature.  Electromyography: Selective needle examination of right lower extremity and right lumbosacral paraspinal muscles were normal.  Conclusion:   This is a normal study.  There is no electrodiagnostic evidence of large fiber peripheral neuropathy or right lumbosacral radiculopathy.   ------------------------------- Marcial Pacas, M.D.  [facility] [place], [place] [address] Tel: [phone] Fax: [phone]        [facility]    Nerve / Sites Muscle Latency Ref. Amplitude Ref. Rel Amp Segments Distance Velocity Ref. Area    ms ms mV mV %  cm m/s m/s mVms  R Peroneal - EDB     Ankle EDB 5.6 ?6.5 4.8 ?2.0 100 Ankle - EDB 9   18.9     Fib head EDB 13.4  4.3  87.9 Fib head - Ankle 35 45 ?44 17.6     Pop fossa EDB 15.6  4.3  99.8 Pop fossa - Fib head 10 46 ?44 18.2         Pop fossa - Ankle      R Tibial - AH     Ankle AH 7.0 ?5.8 4.4 ?4.0 100 Ankle - AH 9   16.6     Pop fossa AH 18.2  3.4  76 Pop fossa - Ankle 44 39 ?41 12.6  L Tibial - AH     Ankle AH 5.7 ?5.8 3.9 ?4.0 100 Ankle - AH 9   15.0     Pop fossa AH 16.3  2.6  66.8 Pop fossa - Ankle 44 41 ?41 12.9           SNC    Nerve / Sites Rec. Site Peak Lat Ref.  Amp Ref. Segments Distance    ms ms V V  cm  R Sural - Ankle (Calf)     Calf Ankle 3.3 ?4.4 18 ?6 Calf - Ankle 14  R  Superficial peroneal - Ankle     Lat leg Ankle 3.8 ?4.4 11 ?6 Lat leg - Ankle 14         F  Wave    Nerve F Lat Ref.   ms ms  R Tibial - AH 68.5 ?56.0  L Tibial - AH 63.6 ?56.0         H Reflex    Nerve H Lat Lat Hmax   ms ms   Left Right Ref. Left Right Ref.  Tibial - [place] 38.4 38.0 ?35.0 38.2 38.0 ?35.0         EMG full       EMG Summary Table    Spontaneous MUAP Recruitment  Muscle IA Fib PSW Fasc Other Amp Dur. Poly Pattern  R. Tibialis anterior Normal None None None _______ Normal Normal Normal Normal  R. Peroneus longus Normal None None None _______ Normal Normal Normal Normal  R. Tibialis posterior Normal None None None _______ Normal Normal Normal Normal  R. Gastrocnemius (Medial head) Normal None None None _______ Normal Normal Normal Normal  R. Vastus lateralis Normal None None None _______ Normal Normal Normal Normal  R. Biceps femoris (short head) Normal None None None _______ Normal Normal Normal Normal  R. Biceps femoris (long head) Normal None None None _______ Normal Normal Normal Normal  R. Gluteus medius Normal None None None _______ Normal Normal Normal Normal  R. Lumbar paraspinals (mid) Normal None None None _______ Normal Normal Normal Normal  R. Lumbar paraspinals (low) Normal None None None _______ Normal Normal Normal Normal

## 2017-10-29 ENCOUNTER — Other Ambulatory Visit: Payer: Self-pay | Admitting: Family Medicine

## 2017-11-22 DIAGNOSIS — M545 Low back pain: Secondary | ICD-10-CM | POA: Diagnosis not present

## 2017-11-23 ENCOUNTER — Other Ambulatory Visit: Payer: Self-pay | Admitting: Family Medicine

## 2017-11-27 DIAGNOSIS — I10 Essential (primary) hypertension: Secondary | ICD-10-CM | POA: Diagnosis not present

## 2017-11-27 DIAGNOSIS — M130 Polyarthritis, unspecified: Secondary | ICD-10-CM | POA: Diagnosis not present

## 2017-11-27 DIAGNOSIS — G894 Chronic pain syndrome: Secondary | ICD-10-CM | POA: Diagnosis not present

## 2017-11-27 DIAGNOSIS — E119 Type 2 diabetes mellitus without complications: Secondary | ICD-10-CM | POA: Diagnosis not present

## 2017-12-01 DIAGNOSIS — M545 Low back pain: Secondary | ICD-10-CM | POA: Diagnosis not present

## 2017-12-11 DIAGNOSIS — M5416 Radiculopathy, lumbar region: Secondary | ICD-10-CM | POA: Diagnosis not present

## 2017-12-18 ENCOUNTER — Other Ambulatory Visit: Payer: Self-pay | Admitting: Family Medicine

## 2017-12-20 ENCOUNTER — Other Ambulatory Visit: Payer: Self-pay | Admitting: Family Medicine

## 2017-12-27 DIAGNOSIS — M5416 Radiculopathy, lumbar region: Secondary | ICD-10-CM | POA: Diagnosis not present

## 2018-01-25 IMAGING — MR MR HEAD W/O CM
8 of 11 series · 33 of 48 positions shown · non-contrast
Comparison: 01/10/2014

CLINICAL DATA: Imbalance. Difficulty walking and right-sided
weakness. History of stroke.

EXAM:
MRI HEAD WITHOUT CONTRAST
TECHNIQUE: Multiplanar, multiecho pulse sequences of the brain and surrounding
structures were obtained without intravenous contrast.

[Series 3: DWI · axial · 3.0mm · 0.71mm/px · z∈[-81,+78]mm · 6 of 54 slices shown (1 of 4)]
[im 1/54]
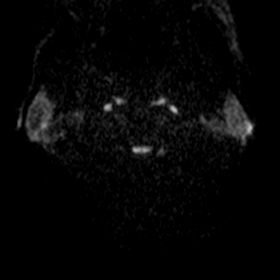
[im 11/54]
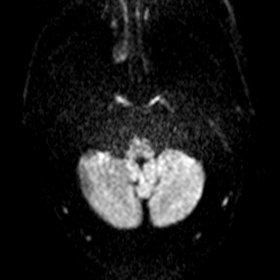
[im 22/54]
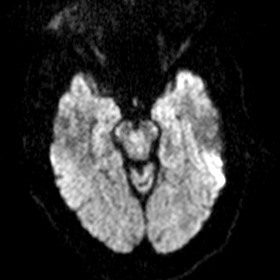
[im 32/54]
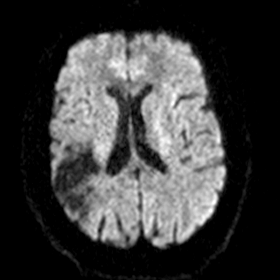
[im 43/54]
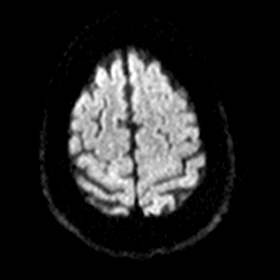
[im 54/54]
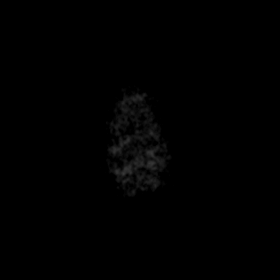

[Series 4: DWI · axial · 3.0mm · 0.78mm/px · z∈[-84,+78]mm · 6 of 55 slices shown (2 of 4)]
[im 1/55]
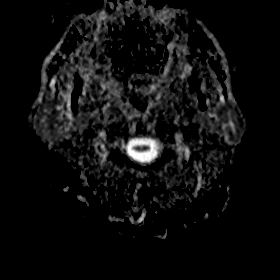
[im 11/55]
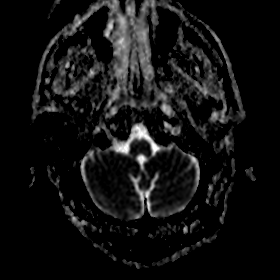
[im 22/55]
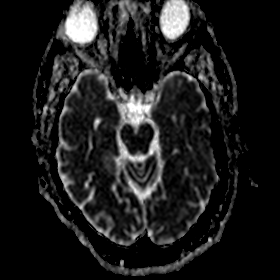
[im 33/55]
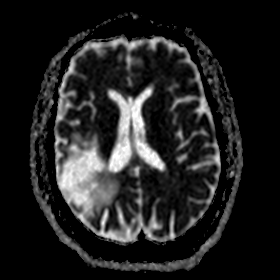
[im 44/55]
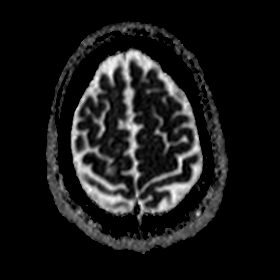
[im 55/55]
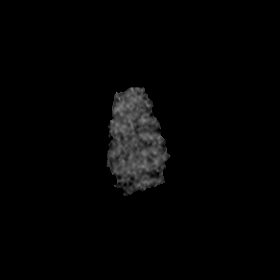

[Series 5: DWI · coronal · 5.0mm · 0.46mm/px · 4 of 38 slices shown (3 of 4)]
[im 1/38]
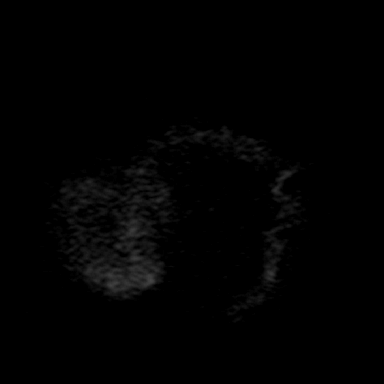
[im 13/38]
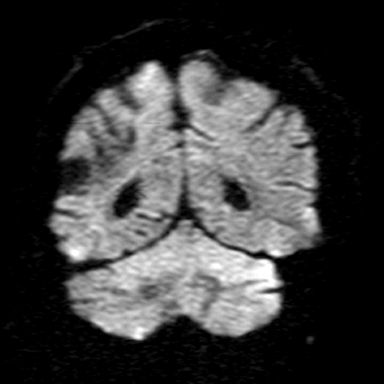
[im 25/38]
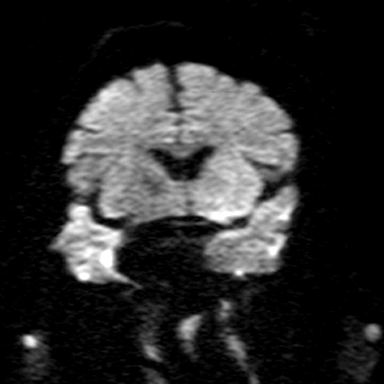
[im 38/38]
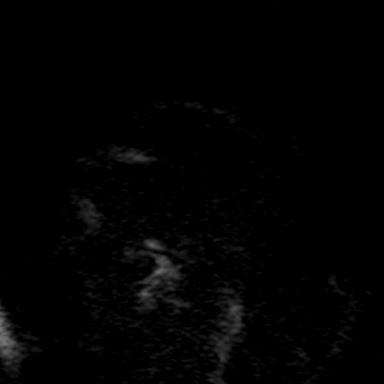

[Series 6: DWI · coronal · 5.0mm · 0.52mm/px · 4 of 38 slices shown (4 of 4)]
[im 1/38]
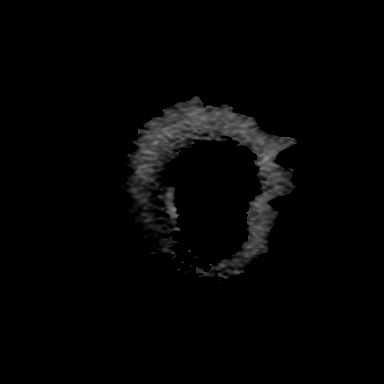
[im 13/38]
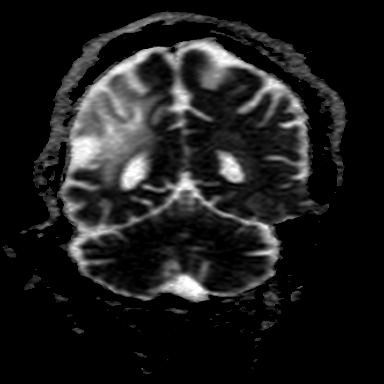
[im 25/38]
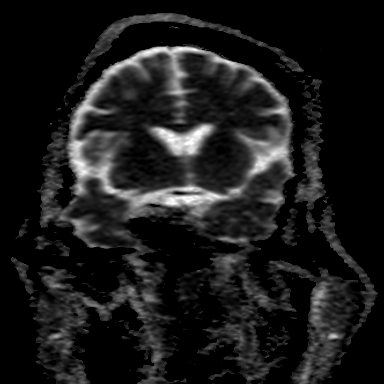
[im 38/38]
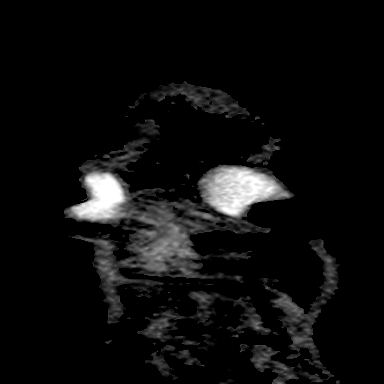

[Series 7: T2 · axial · 5.0mm · 0.48mm/px · z∈[-71,+72]mm · 3 of 23 slices shown (1 of 2)]
[im 1/23]
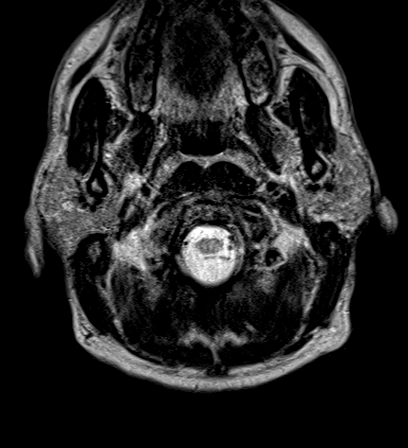
[im 12/23]
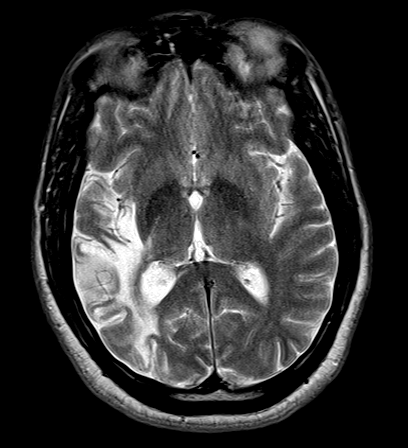
[im 23/23]
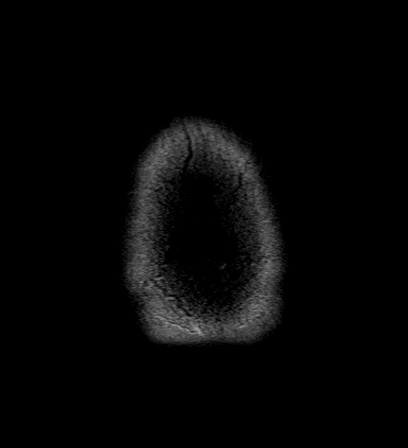

[Series 8: FLAIR · axial · 3.0mm · 0.35mm/px · z∈[-69,+69]mm · 5 of 47 slices shown (1 of 2)]
[im 1/47]
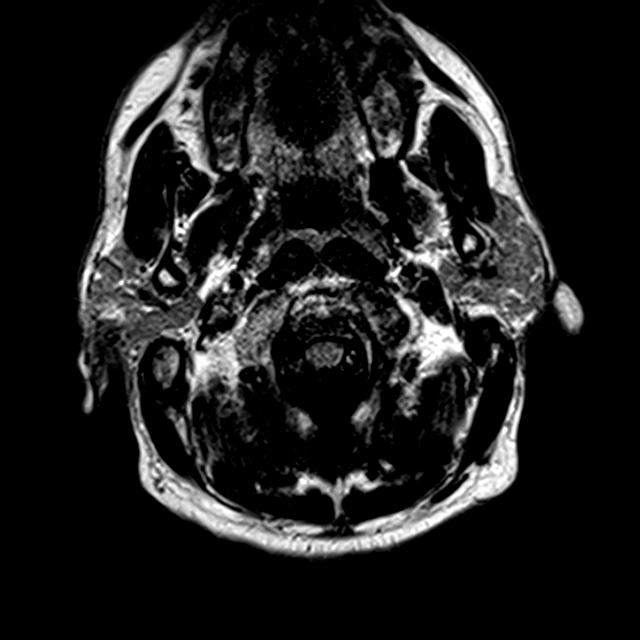
[im 12/47]
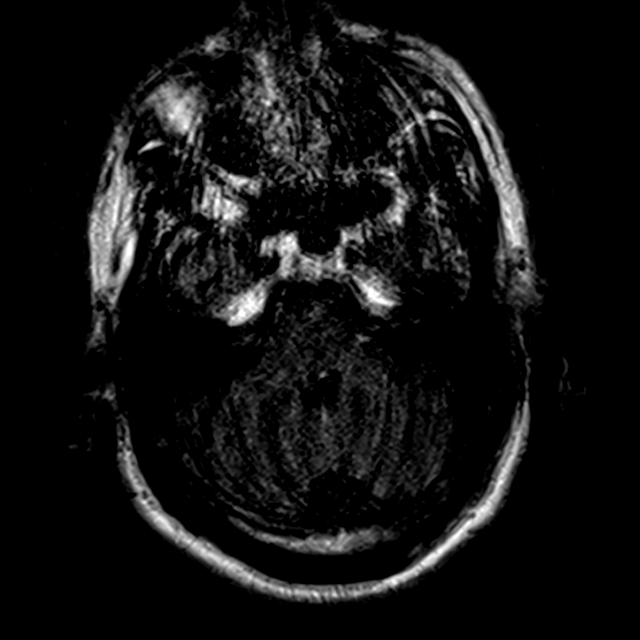
[im 24/47]
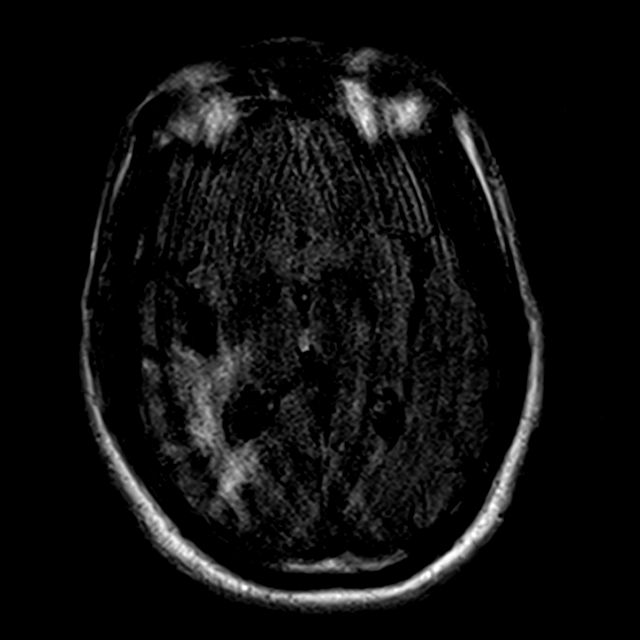
[im 35/47]
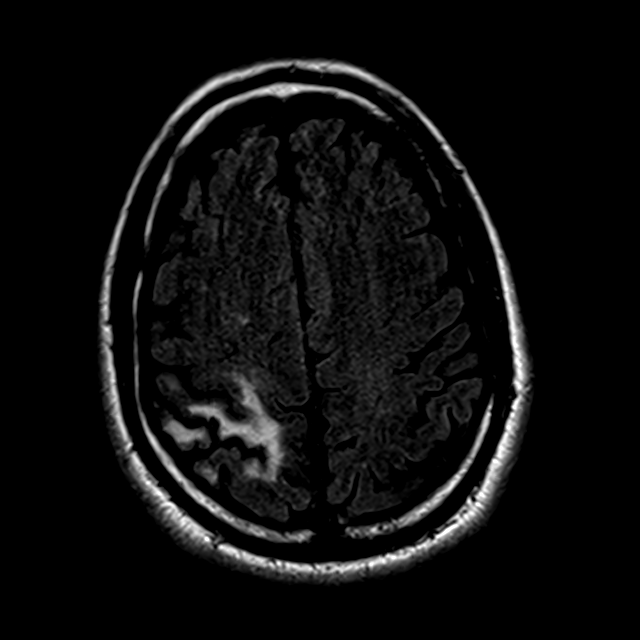
[im 47/47]
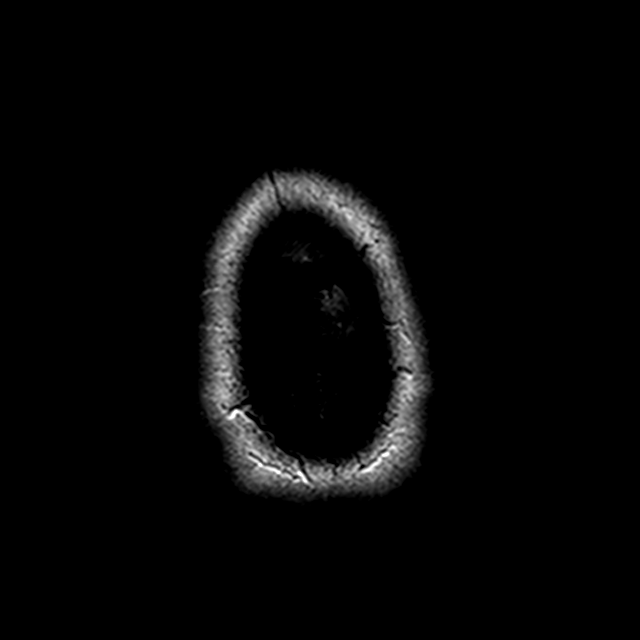

[Series 11: FLAIR · axial · 5.0mm · 0.83mm/px · z∈[-72,+71]mm · 3 of 23 slices shown (2 of 2)]
[im 1/23]
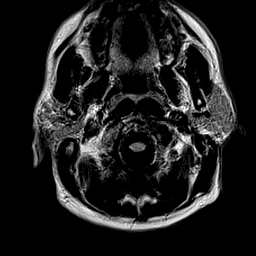
[im 12/23]
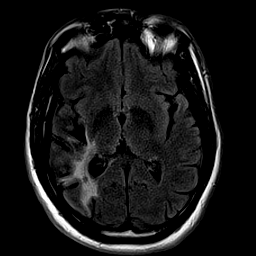
[im 23/23]
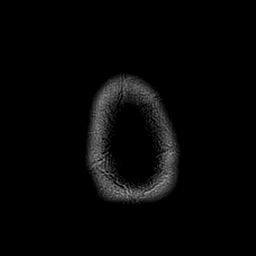

[Series 12: T2 · coronal · 5.0mm · 0.62mm/px · 2 of 28 slices shown (2 of 2)]
[im 1/28]
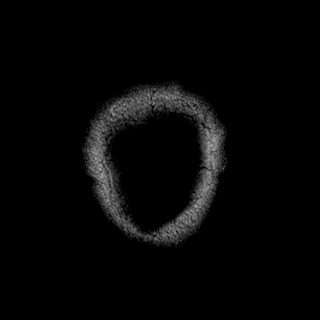
[im 14/28]
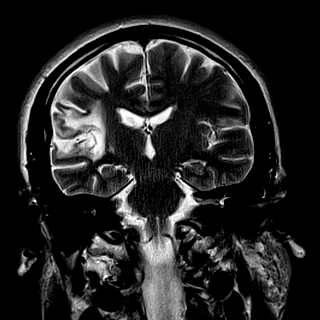

[33 of 48 positions shown; findings below may reference images not displayed]

FINDINGS: The study is mildly motion degraded despite repeated imaging
attempts.

Brain: There is a moderate-sized chronic posterior right MCA
infarct, acute on the prior study. There is associated mild ex vacuo
enlargement of the right lateral ventricle, and there is a small
amount of associated chronic blood products and/or mineralization.
The left cerebral hemisphere is normal in signal, as are the
brainstem and cerebellum. There is no evidence of acute infarct,
mass, midline shift, or extra-axial fluid collection.

Vascular: Major intracranial vascular flow voids are preserved.

Skull and upper cervical spine: Unremarkable bone marrow signal.

Sinuses/Orbits: Unremarkable orbits. Paranasal sinuses and mastoid
air cells are clear.

Other:  None.
IMPRESSION: 1. No acute intracranial abnormality.
2. Chronic right MCA infarct.

## 2018-01-29 DIAGNOSIS — M47816 Spondylosis without myelopathy or radiculopathy, lumbar region: Secondary | ICD-10-CM | POA: Diagnosis not present

## 2018-03-27 DIAGNOSIS — M47816 Spondylosis without myelopathy or radiculopathy, lumbar region: Secondary | ICD-10-CM | POA: Diagnosis not present

## 2018-04-24 DIAGNOSIS — M47816 Spondylosis without myelopathy or radiculopathy, lumbar region: Secondary | ICD-10-CM | POA: Diagnosis not present

## 2018-06-27 DIAGNOSIS — E119 Type 2 diabetes mellitus without complications: Secondary | ICD-10-CM | POA: Diagnosis not present

## 2018-06-27 DIAGNOSIS — M545 Low back pain: Secondary | ICD-10-CM | POA: Diagnosis not present

## 2018-06-27 DIAGNOSIS — I10 Essential (primary) hypertension: Secondary | ICD-10-CM | POA: Diagnosis not present

## 2018-06-27 DIAGNOSIS — M130 Polyarthritis, unspecified: Secondary | ICD-10-CM | POA: Diagnosis not present

## 2018-07-27 ENCOUNTER — Ambulatory Visit
Admission: RE | Admit: 2018-07-27 | Discharge: 2018-07-27 | Disposition: A | Discharge status: Home / Self Care | Payer: Self-pay | Source: Ambulatory Visit | Attending: Family Medicine | Admitting: Family Medicine

## 2018-07-27 ENCOUNTER — Other Ambulatory Visit: Payer: Self-pay | Admitting: Family Medicine

## 2018-07-27 DIAGNOSIS — M25551 Pain in right hip: Secondary | ICD-10-CM

## 2018-07-27 DIAGNOSIS — E119 Type 2 diabetes mellitus without complications: Secondary | ICD-10-CM | POA: Diagnosis not present

## 2018-07-27 DIAGNOSIS — M130 Polyarthritis, unspecified: Secondary | ICD-10-CM | POA: Diagnosis not present

## 2018-07-27 DIAGNOSIS — I10 Essential (primary) hypertension: Secondary | ICD-10-CM | POA: Diagnosis not present

## 2018-07-27 DIAGNOSIS — G894 Chronic pain syndrome: Secondary | ICD-10-CM | POA: Diagnosis not present

## 2018-08-23 DIAGNOSIS — I10 Essential (primary) hypertension: Secondary | ICD-10-CM | POA: Diagnosis not present

## 2018-08-23 DIAGNOSIS — E119 Type 2 diabetes mellitus without complications: Secondary | ICD-10-CM | POA: Diagnosis not present

## 2018-08-23 DIAGNOSIS — M130 Polyarthritis, unspecified: Secondary | ICD-10-CM | POA: Diagnosis not present

## 2018-08-23 DIAGNOSIS — G894 Chronic pain syndrome: Secondary | ICD-10-CM | POA: Diagnosis not present

## 2018-11-26 DIAGNOSIS — I10 Essential (primary) hypertension: Secondary | ICD-10-CM | POA: Diagnosis not present

## 2018-11-26 DIAGNOSIS — M545 Low back pain: Secondary | ICD-10-CM | POA: Diagnosis not present

## 2018-11-26 DIAGNOSIS — G894 Chronic pain syndrome: Secondary | ICD-10-CM | POA: Diagnosis not present

## 2018-11-26 DIAGNOSIS — E119 Type 2 diabetes mellitus without complications: Secondary | ICD-10-CM | POA: Diagnosis not present

## 2018-11-26 DIAGNOSIS — M130 Polyarthritis, unspecified: Secondary | ICD-10-CM | POA: Diagnosis not present

## 2018-12-26 DIAGNOSIS — M25551 Pain in right hip: Secondary | ICD-10-CM | POA: Diagnosis not present

## 2019-01-04 ENCOUNTER — Telehealth: Payer: Self-pay | Admitting: Orthopedic Surgery

## 2019-01-04 NOTE — Telephone Encounter (Signed)
Call from patient inquiring about appointment for right hip. Discussed whether any previous treatment, orthopaedic or otherwise; states only primary care.  Mentioned had an injection in back, which was a "separate thing."  Offered appointment following restrictions. Understands and is aware.

## 2019-02-01 DIAGNOSIS — R55 Syncope and collapse: Secondary | ICD-10-CM | POA: Diagnosis not present

## 2019-02-13 ENCOUNTER — Ambulatory Visit: Payer: BLUE CROSS/BLUE SHIELD | Admitting: Orthopedic Surgery

## 2019-02-25 ENCOUNTER — Ambulatory Visit (INDEPENDENT_AMBULATORY_CARE_PROVIDER_SITE_OTHER): Payer: BLUE CROSS/BLUE SHIELD | Admitting: Orthopedic Surgery

## 2019-02-25 ENCOUNTER — Encounter: Payer: Self-pay | Admitting: Orthopedic Surgery

## 2019-02-25 ENCOUNTER — Other Ambulatory Visit: Payer: Self-pay

## 2019-02-25 ENCOUNTER — Ambulatory Visit (INDEPENDENT_AMBULATORY_CARE_PROVIDER_SITE_OTHER): Payer: BLUE CROSS/BLUE SHIELD

## 2019-02-25 VITALS — BP 135/85 | HR 76 | Temp 97.2°F | Ht 73.0 in | Wt 210.0 lb

## 2019-02-25 DIAGNOSIS — R29898 Other symptoms and signs involving the musculoskeletal system: Secondary | ICD-10-CM | POA: Diagnosis not present

## 2019-02-25 DIAGNOSIS — G8929 Other chronic pain: Secondary | ICD-10-CM | POA: Insufficient documentation

## 2019-02-25 DIAGNOSIS — M5441 Lumbago with sciatica, right side: Secondary | ICD-10-CM | POA: Diagnosis not present

## 2019-02-25 NOTE — Progress Notes (Signed)
NEW PROBLEM OFFICE VISIT  Chief Complaint  Patient presents with  . Back Pain    "dragging" right leg for many years also low back painful right leg numbness     58 year old male's previously seen by Dr. Caprice Beaver at [facility] for problems with his right lower back and right hip with radicular pain into his right leg and knee.  He had 2 injections he says it did not help he is here for follow-up  He has had a stroke about 5 years ago with some residual tremors in the right lower extremity.  He complains of weakness in the right lower extremity with inability to actively lift his leg into hip flexion without assistance often using his right hand to lift his leg when driving     Review of Systems  Musculoskeletal: Positive for back pain and joint pain.  Neurological: Positive for tremors, sensory change and focal weakness.     Past Medical History:  Diagnosis Date  . Diabetes mellitus without complication ([place])   . Hyperlipidemia   . Hypertension   . Stroke ([place]) 2015  . Substance abuse ([place])    Remote  > 20 years ago, cocaine induced vasospasm    Past Surgical History:  Procedure Laterality Date  . LYMPH NODE BIOPSY      Family History  Problem Relation Age of Onset  . Diabetes Mother   . Cancer Mother        pancreas  . Kidney disease Sister   . Diabetes Sister   . Healthy Brother   . Healthy Daughter   . Healthy Sister   . Healthy Sister   . Healthy Brother   . Healthy Brother   . Healthy Brother   . Healthy Brother    Social History   Tobacco Use  . Smoking status: Current Every Day Smoker    Packs/day: 1.50    Years: 35.00    Pack years: 52.50    Types: Cigarettes  . Smokeless tobacco: Never Used  Substance Use Topics  . Alcohol use: Yes    Comment: Occasional use, not to excess  . Drug use: No    Comment: Not since the 80s    No Known Allergies  Current Meds  Medication Sig  . atorvastatin (LIPITOR) 10 MG tablet TAKE 1 TABLET BY  MOUTH EVERY DAY AT 6 PM  . blood glucose meter kit and supplies KIT Dispense based on patient and insurance preference. Use to monitor FSBS 1x daily. Dx: E11.9.  . Glucose Blood (BLOOD GLUCOSE TEST STRIPS) STRP Dispense based on patient and insurance preference. Use to monitor FSBS 1x daily. Dx: E11.9.  . lisinopril (PRINIVIL,ZESTRIL) 40 MG tablet Take 1 tablet (40 mg total) by mouth daily.  . metFORMIN (GLUCOPHAGE) 850 MG tablet TAKE 1 TABLET BY MOUTH DAILY WITH BREAKFAST.  . metoprolol succinate (TOPROL-XL) 50 MG 24 hr tablet TAKE 1 TABLET (50 MG TOTAL) BY MOUTH DAILY. TAKE WITH OR IMMEDIATELY FOLLOWING A MEAL.  . metoprolol tartrate (LOPRESSOR) 50 MG tablet Take 50 mg by mouth daily.  . multivitamin-iron-minerals-folic acid (CENTRUM) chewable tablet Chew 1 tablet by mouth at bedtime.   Glory Rosebush DELICA LANCETS 16X MISC Use to monitor FSBS 1x daily Dx: E11.9  . traMADol (ULTRAM) 50 MG tablet Take 1 tablet (50 mg total) by mouth every 8 (eight) hours as needed.  . vitamin C (ASCORBIC ACID) 500 MG tablet Take 500 mg by mouth daily.  Alveda Reasons 20 MG TABS tablet TAKE 1  TABLET BY MOUTH EVERY DAY WITH SUPPER    BP 135/85   Pulse 76   Temp (!) 97.2 F (36.2 C)   Ht '6\' 1"'  (1.854 m)   Wt 210 lb (95.3 kg)   BMI 27.71 kg/m   Physical Exam Vitals signs and nursing note reviewed.  Constitutional:      Appearance: Normal appearance.  Neurological:     Mental Status: He is alert and oriented to person, place, and time.     Gait: Gait abnormal.     Comments: WALKING STICK, POOR CADENCE AND STRIDE LENGTH IS SHORT   Psychiatric:        Mood and Affect: Mood normal.     Ortho Exam  The left hip range of motion is normal there is no tenderness there his hip feels stable he has good muscle strength and tone no tremor skin is normal pulses are good distally reflexes are hyper reflexes 2+ pulse and normal sensation  The right hip passive range of motion is normal he has poor active flexion with  weakness his range of motion is normal and pain-free his hip feels stable strength is normal he does have a tremor on the right skin is otherwise intact pulses are normal distally there are no sensory abnormalities  MEDICAL DECISION SECTION  Xrays were done at [facility] CLINICAL DATA:  Chronic right hip pain without known injury.   EXAM: DG HIP (WITH OR WITHOUT PELVIS) 2-3V RIGHT   COMPARISON:  None.   FINDINGS: There is no evidence of hip fracture or dislocation. There is no evidence of arthropathy or other focal bone abnormality.   IMPRESSION: Negative.     Electronically Signed   By: Marijo Conception, M.D.   On: 07/27/2018 15:23   My independent reading of xrays:  AP lateral right hip normal x-ray   Internal images show spondylolisthesis L4 and 5 facet arthritis in the lower lumbar segments slight scoliosis    Encounter Diagnoses  Name Primary?  . Right leg weakness   . Chronic midline low back pain with right-sided sciatica Yes    PLAN: (Rx., injectx, surgery, frx, mri/ct) At this point his x-rays do not show any hip pathology  He does show lumbar spine pathology and has had previous injections.  We do not have anyone to do that here or follow him for his back so we are sending him back to go for Ortho for further definitive management  No orders of the defined types were placed in this encounter.   Arther Abbott, MD  02/25/2019 11:10 AM

## 2019-06-20 DIAGNOSIS — M545 Low back pain: Secondary | ICD-10-CM | POA: Diagnosis not present

## 2019-06-20 DIAGNOSIS — I10 Essential (primary) hypertension: Secondary | ICD-10-CM | POA: Diagnosis not present

## 2019-06-20 DIAGNOSIS — E1169 Type 2 diabetes mellitus with other specified complication: Secondary | ICD-10-CM | POA: Diagnosis not present

## 2019-06-20 DIAGNOSIS — E782 Mixed hyperlipidemia: Secondary | ICD-10-CM | POA: Diagnosis not present

## 2019-10-24 DIAGNOSIS — G894 Chronic pain syndrome: Secondary | ICD-10-CM | POA: Diagnosis not present

## 2019-10-24 DIAGNOSIS — M130 Polyarthritis, unspecified: Secondary | ICD-10-CM | POA: Diagnosis not present

## 2019-10-24 DIAGNOSIS — I10 Essential (primary) hypertension: Secondary | ICD-10-CM | POA: Diagnosis not present

## 2019-10-24 DIAGNOSIS — E782 Mixed hyperlipidemia: Secondary | ICD-10-CM | POA: Diagnosis not present

## 2019-10-24 DIAGNOSIS — M545 Low back pain: Secondary | ICD-10-CM | POA: Diagnosis not present

## 2020-05-19 DIAGNOSIS — M545 Low back pain: Secondary | ICD-10-CM | POA: Diagnosis not present

## 2020-05-19 DIAGNOSIS — E1169 Type 2 diabetes mellitus with other specified complication: Secondary | ICD-10-CM | POA: Diagnosis not present

## 2020-05-19 DIAGNOSIS — I10 Essential (primary) hypertension: Secondary | ICD-10-CM | POA: Diagnosis not present

## 2020-05-19 DIAGNOSIS — E782 Mixed hyperlipidemia: Secondary | ICD-10-CM | POA: Diagnosis not present

## 2020-05-19 DIAGNOSIS — N529 Male erectile dysfunction, unspecified: Secondary | ICD-10-CM | POA: Diagnosis not present

## 2020-05-19 DIAGNOSIS — Z125 Encounter for screening for malignant neoplasm of prostate: Secondary | ICD-10-CM | POA: Diagnosis not present

## 2020-05-19 DIAGNOSIS — M130 Polyarthritis, unspecified: Secondary | ICD-10-CM | POA: Diagnosis not present

## 2020-11-26 DIAGNOSIS — G894 Chronic pain syndrome: Secondary | ICD-10-CM | POA: Diagnosis not present

## 2020-11-26 DIAGNOSIS — E1169 Type 2 diabetes mellitus with other specified complication: Secondary | ICD-10-CM | POA: Diagnosis not present

## 2020-11-26 DIAGNOSIS — M545 Low back pain, unspecified: Secondary | ICD-10-CM | POA: Diagnosis not present

## 2020-11-26 DIAGNOSIS — E782 Mixed hyperlipidemia: Secondary | ICD-10-CM | POA: Diagnosis not present

## 2020-11-26 DIAGNOSIS — M130 Polyarthritis, unspecified: Secondary | ICD-10-CM | POA: Diagnosis not present

## 2020-11-26 DIAGNOSIS — Z125 Encounter for screening for malignant neoplasm of prostate: Secondary | ICD-10-CM | POA: Diagnosis not present

## 2020-11-26 DIAGNOSIS — I10 Essential (primary) hypertension: Secondary | ICD-10-CM | POA: Diagnosis not present

## 2021-02-11 DIAGNOSIS — E119 Type 2 diabetes mellitus without complications: Secondary | ICD-10-CM | POA: Diagnosis not present

## 2021-02-11 DIAGNOSIS — I639 Cerebral infarction, unspecified: Secondary | ICD-10-CM | POA: Diagnosis not present

## 2021-02-11 DIAGNOSIS — I10 Essential (primary) hypertension: Secondary | ICD-10-CM | POA: Diagnosis not present

## 2021-02-11 DIAGNOSIS — E785 Hyperlipidemia, unspecified: Secondary | ICD-10-CM | POA: Diagnosis not present

## 2021-02-23 DIAGNOSIS — I10 Essential (primary) hypertension: Secondary | ICD-10-CM | POA: Diagnosis not present

## 2021-02-23 DIAGNOSIS — E119 Type 2 diabetes mellitus without complications: Secondary | ICD-10-CM | POA: Diagnosis not present

## 2021-02-23 DIAGNOSIS — E785 Hyperlipidemia, unspecified: Secondary | ICD-10-CM | POA: Diagnosis not present

## 2021-02-25 DIAGNOSIS — E785 Hyperlipidemia, unspecified: Secondary | ICD-10-CM | POA: Diagnosis not present

## 2021-02-25 DIAGNOSIS — E119 Type 2 diabetes mellitus without complications: Secondary | ICD-10-CM | POA: Diagnosis not present

## 2021-02-25 DIAGNOSIS — I10 Essential (primary) hypertension: Secondary | ICD-10-CM | POA: Diagnosis not present

## 2021-02-25 DIAGNOSIS — M47896 Other spondylosis, lumbar region: Secondary | ICD-10-CM | POA: Diagnosis not present

## 2021-03-29 DIAGNOSIS — M5459 Other low back pain: Secondary | ICD-10-CM | POA: Diagnosis not present

## 2021-04-21 DIAGNOSIS — M5416 Radiculopathy, lumbar region: Secondary | ICD-10-CM | POA: Diagnosis not present

## 2021-04-30 DIAGNOSIS — M545 Low back pain, unspecified: Secondary | ICD-10-CM | POA: Diagnosis not present

## 2021-08-19 ENCOUNTER — Other Ambulatory Visit: Payer: Self-pay

## 2021-08-19 ENCOUNTER — Telehealth: Payer: Self-pay | Admitting: Neurology

## 2021-08-19 ENCOUNTER — Ambulatory Visit (INDEPENDENT_AMBULATORY_CARE_PROVIDER_SITE_OTHER): Payer: BC Managed Care – PPO | Admitting: Neurology

## 2021-08-19 ENCOUNTER — Encounter: Payer: Self-pay | Admitting: Neurology

## 2021-08-19 VITALS — BP 123/79 | HR 70 | Ht 73.0 in | Wt 200.0 lb

## 2021-08-19 DIAGNOSIS — R29898 Other symptoms and signs involving the musculoskeletal system: Secondary | ICD-10-CM | POA: Diagnosis not present

## 2021-08-19 DIAGNOSIS — R269 Unspecified abnormalities of gait and mobility: Secondary | ICD-10-CM

## 2021-08-19 DIAGNOSIS — I639 Cerebral infarction, unspecified: Secondary | ICD-10-CM

## 2021-08-19 NOTE — Progress Notes (Signed)
Chief Complaint  Patient presents with   New Patient (Initial Visit)    Rm 14, with wife,/pain in lumbar spine      ASSESSMENT AND PLAN  Aaron Medina is a 60 y.o. male   History of right MCA stroke involving right insular cortex, right operculum, extending to right frontal parietal region April 2015,  With excellent recovery, only has mild residual left arm weakness at its past  Atrial fibrillation  On chronic Xarelto treatment, Gradual onset right leg weakness, worsening gait abnormality, urinary urgency, constipation, since 2019, progressively worsening  On examination, mild fixation of left upper extremity upon rapid rotating movement, profound right lower extremity weakness, especially right hamstring muscles, ankle dorsiflexion, eversion, hyperreflexia, bilateral Babinski signs,  Need to rule out left parasagittal frontal pathology, also right cervical cord abnormality,  MRI of the brain, and cervical spine, if there is no significant pathology showed, may consider MRI of thoracic spine  Home physical therapy   DIAGNOSTIC DATA (LABS, IMAGING, TESTING) - I reviewed patient records, labs, notes, testing and imaging myself where available. MRI [facility] lumbar [facility] on April 21, 2021, mild L4-5 spinal stenosis, small to moderate left median directly synovial cyst contents upon left lateral thecal sac, with mild ventral displacement of the descending L4 nerve roots, moderate severe bilateral neuroforaminal narrowing impinging upon and displacing bilateral L4 nerve roots.  Moderate to severe right and mild to moderate left L5-S1 neuroforaminal narrowing, impinging upon the right lateral V nerve roots, without spinal stenosis, L3-4, no significant canal or foraminal stenosis multilevel degenerative changes    MEDICAL HISTORY:  Aaron Medina is a 60 year old male, seen in request by orthopedic PA Charlyne Petrin, for evaluation of gait abnormality, his primary care  physician is Dr. Nevada Crane, Edwinna Areola, MD initial evaluation was with his wife on August 19, 2021  I reviewed and summarized the referring note. PMHX DM Aifb HTN HLD Stroke, left hemiparesis 2015 [place],   He suffered stroke in April 2015, presented with left arm more than left weakness, I personally reviewed MRI of the brain in April, 2015, acute right MCA stroke involving right insular cortex, right frontal, extending to right frontal parietal region, he was diagnosed with atrial fibrillation, started anticoagulation treatment  Post his stroke, he recovered very well, no significant gait abnormality for a while  Around 2019, he began to develop slow worsening gait abnormality that mainly involving right leg, he describes right posterior thigh area tightness, difficulty bending his right knee, right hip, low back pain  His gait abnormality gradually getting worse, to the point, he use cane, sometimes use wheelchair for longer distance, also developed worsening urinary urgency, occasionally incontinence, worsening chronic constipation  He was seen by orthopedic surgeon, personally reviewed MRI of of lumbar spine from [facility] in July 2022, multilevel degenerative changes, no significant canal stenosis, variable degree of foraminal narrowing, left 4 5, severe left foraminal stenosis, moderate severe bilateral L5-S1  MRI of the brain in March 2018, no acute abnormality, chronic right MCA stroke. Right hip x-ray October 2019 showed no significant abnormality.  PHYSICAL EXAM:   Vitals:   08/19/21 1448  BP: 123/79  Pulse: 70  Weight: 200 lb (90.7 kg)  Height: '6\' 1"'  (1.854 m)   Not recorded     Body mass index is 26.39 kg/m.  PHYSICAL EXAMNIATION:  Gen: NAD, conversant, well nourised, well groomed  Cardiovascular: Regular rate rhythm, no peripheral edema, warm, nontender. Eyes: Conjunctivae clear without exudates or hemorrhage Neck: Supple, no carotid  bruits. Pulmonary: Clear to auscultation bilaterally   NEUROLOGICAL EXAM:  MENTAL STATUS: Speech:    Speech is normal; fluent and spontaneous with normal comprehension.  Cognition:     Orientation to time, place and person     Normal recent and remote memory     Normal Attention span and concentration     Normal Language, naming, repeating,spontaneous speech     Fund of knowledge   CRANIAL NERVES: CN II: Visual fields are full to confrontation. Pupils are round equal and briskly reactive to light. CN III, IV, VI: extraocular movement are normal. No ptosis. CN V: Facial sensation is intact to light touch CN VII: Face is symmetric with normal eye closure  CN VIII: Hearing is normal to causal conversation. CN IX, X: Phonation is normal. CN XI: Head turning and shoulder shrug are intact  MOTOR: Fixation of left arm on rapid rotating movement, mild left arm spasticity, Mild to moderate bilateral lower extremity spasticity  LE Hip Flexion Knee flexion Knee extension Ankle Dorsiflexion Eversion Ankle plantar Flexion Inversion  R '3 2 4 3 3 4 3  ' L '5 5 5 5 5 5 5    ' REFLEXES: Reflexes are 3 and symmetric at the biceps, triceps, knees, and right nonsustained ankle clonus,. Plantar responses are extensor bilaterally   SENSORY: decreased right lower extremity vibratory sensation to knee level, preserved pinprick  COORDINATION: There is no trunk or limb dysmetria noted.  GAIT/STANCE: Need assistant to get up from seated position, cannot bend right knee, drag right leg across the floor, very unsteady  REVIEW OF SYSTEMS:  Full 14 system review of systems performed and notable only for as above All other review of systems were negative.   ALLERGIES: No Known Allergies  HOME MEDICATIONS: Current Outpatient Medications  Medication Sig Dispense Refill   atorvastatin (LIPITOR) 10 MG tablet TAKE 1 TABLET BY MOUTH EVERY DAY AT 6 PM 30 tablet 6   blood glucose meter kit and supplies KIT  Dispense based on patient and insurance preference. Use to monitor FSBS 1x daily. Dx: E11.9. 1 each 1   Glucose Blood (BLOOD GLUCOSE TEST STRIPS) STRP Dispense based on patient and insurance preference. Use to monitor FSBS 1x daily. Dx: E11.9. 50 each 11   lisinopril (PRINIVIL,ZESTRIL) 40 MG tablet Take 1 tablet (40 mg total) by mouth daily. 90 tablet 3   metFORMIN (GLUCOPHAGE) 850 MG tablet TAKE 1 TABLET BY MOUTH DAILY WITH BREAKFAST. 30 tablet 6   metoprolol succinate (TOPROL-XL) 50 MG 24 hr tablet TAKE 1 TABLET (50 MG TOTAL) BY MOUTH DAILY. TAKE WITH OR IMMEDIATELY FOLLOWING A MEAL. 30 tablet 6   metoprolol tartrate (LOPRESSOR) 50 MG tablet Take 50 mg by mouth daily.     multivitamin-iron-minerals-folic acid (CENTRUM) chewable tablet Chew 1 tablet by mouth at bedtime.      ONETOUCH DELICA LANCETS 62B MISC Use to monitor FSBS 1x daily Dx: E11.9 100 each 6   traMADol (ULTRAM) 50 MG tablet Take 1 tablet (50 mg total) by mouth every 8 (eight) hours as needed. 30 tablet 2   vitamin C (ASCORBIC ACID) 500 MG tablet Take 500 mg by mouth daily.     XARELTO 20 MG TABS tablet TAKE 1 TABLET BY MOUTH EVERY DAY WITH SUPPER 30 tablet 6   No current facility-administered medications for this visit.    PAST MEDICAL HISTORY: Past  Medical History:  Diagnosis Date   Diabetes mellitus without complication ([place])    Hyperlipidemia    Hypertension    Stroke ([place]) 2015   Substance abuse ([place])    Remote  > 20 years ago, cocaine induced vasospasm    PAST SURGICAL HISTORY: Past Surgical History:  Procedure Laterality Date   LYMPH NODE BIOPSY      FAMILY HISTORY: Family History  Problem Relation Age of Onset   Diabetes Mother    Cancer Mother        pancreas   Kidney disease Sister    Diabetes Sister    Healthy Brother    Healthy Daughter    Healthy Sister    Healthy Sister    Healthy Brother    Healthy Brother    Healthy Brother    Healthy Brother     SOCIAL HISTORY: Social History    Socioeconomic History   Marital status: Married    Spouse name: Not on file   Number of children: 0   Years of education: 12   Highest education level: Not on file  Occupational History   Occupation: Works in shipping/recieving. Forklift driver.  Tobacco Use   Smoking status: Every Day    Packs/day: 1.50    Years: 35.00    Pack years: 52.50    Types: Cigarettes   Smokeless tobacco: Never  Substance and Sexual Activity   Alcohol use: Yes    Comment: Occasional use, not to excess   Drug use: No    Comment: Not since the 80s   Sexual activity: Not on file  Other Topics Concern   Not on file  Social History Narrative   Lives with wife.   Social Determinants of Health   Financial Resource Strain: Not on file  Food Insecurity: Not on file  Transportation Needs: Not on file  Physical Activity: Not on file  Stress: Not on file  Social Connections: Not on file  Intimate Partner Violence: Not on file      Marcial Pacas, M.D. Ph.D.  [facility] [address] [place], [place] [address] Ph: [phone] Fax: [phone]  CC:  Charlyne Petrin, PA-C [address] [place],  [place] [address]  Celene Squibb, MD

## 2021-08-19 NOTE — Telephone Encounter (Signed)
Patient is aware they will need to be worked in for this appointment and will get a phone call.

## 2021-08-25 ENCOUNTER — Telehealth: Payer: Self-pay | Admitting: Neurology

## 2021-08-25 NOTE — Telephone Encounter (Signed)
Home health referral accepted by [facility]. They will call patient to start care.

## 2021-08-26 ENCOUNTER — Telehealth: Payer: Self-pay | Admitting: Neurology

## 2021-08-26 DIAGNOSIS — M48061 Spinal stenosis, lumbar region without neurogenic claudication: Secondary | ICD-10-CM | POA: Diagnosis not present

## 2021-08-26 DIAGNOSIS — M25511 Pain in right shoulder: Secondary | ICD-10-CM | POA: Diagnosis not present

## 2021-08-26 DIAGNOSIS — Z7901 Long term (current) use of anticoagulants: Secondary | ICD-10-CM | POA: Diagnosis not present

## 2021-08-26 DIAGNOSIS — E119 Type 2 diabetes mellitus without complications: Secondary | ICD-10-CM | POA: Diagnosis not present

## 2021-08-26 DIAGNOSIS — I480 Paroxysmal atrial fibrillation: Secondary | ICD-10-CM | POA: Diagnosis not present

## 2021-08-26 DIAGNOSIS — I10 Essential (primary) hypertension: Secondary | ICD-10-CM | POA: Diagnosis not present

## 2021-08-26 DIAGNOSIS — M47816 Spondylosis without myelopathy or radiculopathy, lumbar region: Secondary | ICD-10-CM | POA: Diagnosis not present

## 2021-08-26 DIAGNOSIS — F17210 Nicotine dependence, cigarettes, uncomplicated: Secondary | ICD-10-CM | POA: Diagnosis not present

## 2021-08-26 DIAGNOSIS — E785 Hyperlipidemia, unspecified: Secondary | ICD-10-CM | POA: Diagnosis not present

## 2021-08-26 DIAGNOSIS — M4310 Spondylolisthesis, site unspecified: Secondary | ICD-10-CM | POA: Diagnosis not present

## 2021-08-26 DIAGNOSIS — M5441 Lumbago with sciatica, right side: Secondary | ICD-10-CM | POA: Diagnosis not present

## 2021-08-26 DIAGNOSIS — Z7984 Long term (current) use of oral hypoglycemic drugs: Secondary | ICD-10-CM | POA: Diagnosis not present

## 2021-08-26 DIAGNOSIS — M25512 Pain in left shoulder: Secondary | ICD-10-CM | POA: Diagnosis not present

## 2021-08-26 DIAGNOSIS — M1990 Unspecified osteoarthritis, unspecified site: Secondary | ICD-10-CM | POA: Diagnosis not present

## 2021-08-26 DIAGNOSIS — D75839 Thrombocytosis, unspecified: Secondary | ICD-10-CM | POA: Diagnosis not present

## 2021-08-26 DIAGNOSIS — K5909 Other constipation: Secondary | ICD-10-CM | POA: Diagnosis not present

## 2021-08-26 NOTE — Telephone Encounter (Signed)
Medicare/BCBS Josem Kaufmann: NPR Ref # [id number] order sent to [facility], [facility] they will reach out to the patient to schedule.

## 2021-08-27 DIAGNOSIS — I10 Essential (primary) hypertension: Secondary | ICD-10-CM | POA: Diagnosis not present

## 2021-09-03 DIAGNOSIS — E785 Hyperlipidemia, unspecified: Secondary | ICD-10-CM | POA: Diagnosis not present

## 2021-09-03 DIAGNOSIS — M25512 Pain in left shoulder: Secondary | ICD-10-CM | POA: Diagnosis not present

## 2021-09-03 DIAGNOSIS — M1990 Unspecified osteoarthritis, unspecified site: Secondary | ICD-10-CM | POA: Diagnosis not present

## 2021-09-03 DIAGNOSIS — E119 Type 2 diabetes mellitus without complications: Secondary | ICD-10-CM | POA: Diagnosis not present

## 2021-09-03 DIAGNOSIS — I480 Paroxysmal atrial fibrillation: Secondary | ICD-10-CM | POA: Diagnosis not present

## 2021-09-03 DIAGNOSIS — F17210 Nicotine dependence, cigarettes, uncomplicated: Secondary | ICD-10-CM | POA: Diagnosis not present

## 2021-09-03 DIAGNOSIS — M48061 Spinal stenosis, lumbar region without neurogenic claudication: Secondary | ICD-10-CM | POA: Diagnosis not present

## 2021-09-03 DIAGNOSIS — I10 Essential (primary) hypertension: Secondary | ICD-10-CM | POA: Diagnosis not present

## 2021-09-03 DIAGNOSIS — Z7901 Long term (current) use of anticoagulants: Secondary | ICD-10-CM | POA: Diagnosis not present

## 2021-09-03 DIAGNOSIS — K5909 Other constipation: Secondary | ICD-10-CM | POA: Diagnosis not present

## 2021-09-03 DIAGNOSIS — M25511 Pain in right shoulder: Secondary | ICD-10-CM | POA: Diagnosis not present

## 2021-09-03 DIAGNOSIS — M47816 Spondylosis without myelopathy or radiculopathy, lumbar region: Secondary | ICD-10-CM | POA: Diagnosis not present

## 2021-09-03 DIAGNOSIS — Z7984 Long term (current) use of oral hypoglycemic drugs: Secondary | ICD-10-CM | POA: Diagnosis not present

## 2021-09-03 DIAGNOSIS — M5441 Lumbago with sciatica, right side: Secondary | ICD-10-CM | POA: Diagnosis not present

## 2021-09-03 DIAGNOSIS — M4310 Spondylolisthesis, site unspecified: Secondary | ICD-10-CM | POA: Diagnosis not present

## 2021-09-03 DIAGNOSIS — D75839 Thrombocytosis, unspecified: Secondary | ICD-10-CM | POA: Diagnosis not present

## 2021-09-06 DIAGNOSIS — M1990 Unspecified osteoarthritis, unspecified site: Secondary | ICD-10-CM | POA: Diagnosis not present

## 2021-09-06 DIAGNOSIS — M5441 Lumbago with sciatica, right side: Secondary | ICD-10-CM | POA: Diagnosis not present

## 2021-09-06 DIAGNOSIS — M4310 Spondylolisthesis, site unspecified: Secondary | ICD-10-CM | POA: Diagnosis not present

## 2021-09-06 DIAGNOSIS — M47816 Spondylosis without myelopathy or radiculopathy, lumbar region: Secondary | ICD-10-CM | POA: Diagnosis not present

## 2021-09-06 DIAGNOSIS — K5909 Other constipation: Secondary | ICD-10-CM | POA: Diagnosis not present

## 2021-09-06 DIAGNOSIS — I10 Essential (primary) hypertension: Secondary | ICD-10-CM | POA: Diagnosis not present

## 2021-09-06 DIAGNOSIS — D75839 Thrombocytosis, unspecified: Secondary | ICD-10-CM | POA: Diagnosis not present

## 2021-09-06 DIAGNOSIS — F17210 Nicotine dependence, cigarettes, uncomplicated: Secondary | ICD-10-CM | POA: Diagnosis not present

## 2021-09-06 DIAGNOSIS — M25511 Pain in right shoulder: Secondary | ICD-10-CM | POA: Diagnosis not present

## 2021-09-06 DIAGNOSIS — I480 Paroxysmal atrial fibrillation: Secondary | ICD-10-CM | POA: Diagnosis not present

## 2021-09-06 DIAGNOSIS — E785 Hyperlipidemia, unspecified: Secondary | ICD-10-CM | POA: Diagnosis not present

## 2021-09-06 DIAGNOSIS — M48061 Spinal stenosis, lumbar region without neurogenic claudication: Secondary | ICD-10-CM | POA: Diagnosis not present

## 2021-09-06 DIAGNOSIS — Z7901 Long term (current) use of anticoagulants: Secondary | ICD-10-CM | POA: Diagnosis not present

## 2021-09-06 DIAGNOSIS — Z7984 Long term (current) use of oral hypoglycemic drugs: Secondary | ICD-10-CM | POA: Diagnosis not present

## 2021-09-06 DIAGNOSIS — M25512 Pain in left shoulder: Secondary | ICD-10-CM | POA: Diagnosis not present

## 2021-09-06 DIAGNOSIS — E119 Type 2 diabetes mellitus without complications: Secondary | ICD-10-CM | POA: Diagnosis not present

## 2021-09-13 DIAGNOSIS — D75839 Thrombocytosis, unspecified: Secondary | ICD-10-CM | POA: Diagnosis not present

## 2021-09-13 DIAGNOSIS — M47896 Other spondylosis, lumbar region: Secondary | ICD-10-CM | POA: Diagnosis not present

## 2021-09-13 DIAGNOSIS — M47816 Spondylosis without myelopathy or radiculopathy, lumbar region: Secondary | ICD-10-CM | POA: Diagnosis not present

## 2021-09-13 DIAGNOSIS — M48061 Spinal stenosis, lumbar region without neurogenic claudication: Secondary | ICD-10-CM | POA: Diagnosis not present

## 2021-09-13 DIAGNOSIS — E119 Type 2 diabetes mellitus without complications: Secondary | ICD-10-CM | POA: Diagnosis not present

## 2021-09-13 DIAGNOSIS — I10 Essential (primary) hypertension: Secondary | ICD-10-CM | POA: Diagnosis not present

## 2021-09-13 DIAGNOSIS — M25512 Pain in left shoulder: Secondary | ICD-10-CM | POA: Diagnosis not present

## 2021-09-13 DIAGNOSIS — Z7984 Long term (current) use of oral hypoglycemic drugs: Secondary | ICD-10-CM | POA: Diagnosis not present

## 2021-09-13 DIAGNOSIS — F17210 Nicotine dependence, cigarettes, uncomplicated: Secondary | ICD-10-CM | POA: Diagnosis not present

## 2021-09-13 DIAGNOSIS — K5909 Other constipation: Secondary | ICD-10-CM | POA: Diagnosis not present

## 2021-09-13 DIAGNOSIS — M4310 Spondylolisthesis, site unspecified: Secondary | ICD-10-CM | POA: Diagnosis not present

## 2021-09-13 DIAGNOSIS — Z7901 Long term (current) use of anticoagulants: Secondary | ICD-10-CM | POA: Diagnosis not present

## 2021-09-13 DIAGNOSIS — E785 Hyperlipidemia, unspecified: Secondary | ICD-10-CM | POA: Diagnosis not present

## 2021-09-13 DIAGNOSIS — M5441 Lumbago with sciatica, right side: Secondary | ICD-10-CM | POA: Diagnosis not present

## 2021-09-13 DIAGNOSIS — M1990 Unspecified osteoarthritis, unspecified site: Secondary | ICD-10-CM | POA: Diagnosis not present

## 2021-09-13 DIAGNOSIS — I480 Paroxysmal atrial fibrillation: Secondary | ICD-10-CM | POA: Diagnosis not present

## 2021-09-13 DIAGNOSIS — M25511 Pain in right shoulder: Secondary | ICD-10-CM | POA: Diagnosis not present

## 2021-09-20 DIAGNOSIS — M47816 Spondylosis without myelopathy or radiculopathy, lumbar region: Secondary | ICD-10-CM | POA: Diagnosis not present

## 2021-09-20 DIAGNOSIS — Z7984 Long term (current) use of oral hypoglycemic drugs: Secondary | ICD-10-CM | POA: Diagnosis not present

## 2021-09-20 DIAGNOSIS — M4310 Spondylolisthesis, site unspecified: Secondary | ICD-10-CM | POA: Diagnosis not present

## 2021-09-20 DIAGNOSIS — M25512 Pain in left shoulder: Secondary | ICD-10-CM | POA: Diagnosis not present

## 2021-09-20 DIAGNOSIS — I10 Essential (primary) hypertension: Secondary | ICD-10-CM | POA: Diagnosis not present

## 2021-09-20 DIAGNOSIS — I480 Paroxysmal atrial fibrillation: Secondary | ICD-10-CM | POA: Diagnosis not present

## 2021-09-20 DIAGNOSIS — E119 Type 2 diabetes mellitus without complications: Secondary | ICD-10-CM | POA: Diagnosis not present

## 2021-09-20 DIAGNOSIS — E785 Hyperlipidemia, unspecified: Secondary | ICD-10-CM | POA: Diagnosis not present

## 2021-09-20 DIAGNOSIS — M25511 Pain in right shoulder: Secondary | ICD-10-CM | POA: Diagnosis not present

## 2021-09-20 DIAGNOSIS — F17210 Nicotine dependence, cigarettes, uncomplicated: Secondary | ICD-10-CM | POA: Diagnosis not present

## 2021-09-20 DIAGNOSIS — M48061 Spinal stenosis, lumbar region without neurogenic claudication: Secondary | ICD-10-CM | POA: Diagnosis not present

## 2021-09-20 DIAGNOSIS — M1990 Unspecified osteoarthritis, unspecified site: Secondary | ICD-10-CM | POA: Diagnosis not present

## 2021-09-20 DIAGNOSIS — M5441 Lumbago with sciatica, right side: Secondary | ICD-10-CM | POA: Diagnosis not present

## 2021-09-20 DIAGNOSIS — Z7901 Long term (current) use of anticoagulants: Secondary | ICD-10-CM | POA: Diagnosis not present

## 2021-09-20 DIAGNOSIS — K5909 Other constipation: Secondary | ICD-10-CM | POA: Diagnosis not present

## 2021-09-20 DIAGNOSIS — D75839 Thrombocytosis, unspecified: Secondary | ICD-10-CM | POA: Diagnosis not present

## 2021-09-24 DIAGNOSIS — M5441 Lumbago with sciatica, right side: Secondary | ICD-10-CM | POA: Diagnosis not present

## 2021-09-24 DIAGNOSIS — I480 Paroxysmal atrial fibrillation: Secondary | ICD-10-CM | POA: Diagnosis not present

## 2021-09-24 DIAGNOSIS — Z7901 Long term (current) use of anticoagulants: Secondary | ICD-10-CM | POA: Diagnosis not present

## 2021-09-24 DIAGNOSIS — M1990 Unspecified osteoarthritis, unspecified site: Secondary | ICD-10-CM | POA: Diagnosis not present

## 2021-09-24 DIAGNOSIS — Z7984 Long term (current) use of oral hypoglycemic drugs: Secondary | ICD-10-CM | POA: Diagnosis not present

## 2021-09-24 DIAGNOSIS — M25511 Pain in right shoulder: Secondary | ICD-10-CM | POA: Diagnosis not present

## 2021-09-24 DIAGNOSIS — E119 Type 2 diabetes mellitus without complications: Secondary | ICD-10-CM | POA: Diagnosis not present

## 2021-09-24 DIAGNOSIS — F17210 Nicotine dependence, cigarettes, uncomplicated: Secondary | ICD-10-CM | POA: Diagnosis not present

## 2021-09-24 DIAGNOSIS — M47816 Spondylosis without myelopathy or radiculopathy, lumbar region: Secondary | ICD-10-CM | POA: Diagnosis not present

## 2021-09-24 DIAGNOSIS — M25512 Pain in left shoulder: Secondary | ICD-10-CM | POA: Diagnosis not present

## 2021-09-24 DIAGNOSIS — M48061 Spinal stenosis, lumbar region without neurogenic claudication: Secondary | ICD-10-CM | POA: Diagnosis not present

## 2021-09-24 DIAGNOSIS — K5909 Other constipation: Secondary | ICD-10-CM | POA: Diagnosis not present

## 2021-09-24 DIAGNOSIS — I10 Essential (primary) hypertension: Secondary | ICD-10-CM | POA: Diagnosis not present

## 2021-09-24 DIAGNOSIS — E785 Hyperlipidemia, unspecified: Secondary | ICD-10-CM | POA: Diagnosis not present

## 2021-09-24 DIAGNOSIS — M4310 Spondylolisthesis, site unspecified: Secondary | ICD-10-CM | POA: Diagnosis not present

## 2021-09-24 DIAGNOSIS — D75839 Thrombocytosis, unspecified: Secondary | ICD-10-CM | POA: Diagnosis not present

## 2021-10-06 DIAGNOSIS — M1990 Unspecified osteoarthritis, unspecified site: Secondary | ICD-10-CM | POA: Diagnosis not present

## 2021-10-06 DIAGNOSIS — D75839 Thrombocytosis, unspecified: Secondary | ICD-10-CM | POA: Diagnosis not present

## 2021-10-06 DIAGNOSIS — M5441 Lumbago with sciatica, right side: Secondary | ICD-10-CM | POA: Diagnosis not present

## 2021-10-06 DIAGNOSIS — E785 Hyperlipidemia, unspecified: Secondary | ICD-10-CM | POA: Diagnosis not present

## 2021-10-06 DIAGNOSIS — I480 Paroxysmal atrial fibrillation: Secondary | ICD-10-CM | POA: Diagnosis not present

## 2021-10-06 DIAGNOSIS — K5909 Other constipation: Secondary | ICD-10-CM | POA: Diagnosis not present

## 2021-10-06 DIAGNOSIS — M25512 Pain in left shoulder: Secondary | ICD-10-CM | POA: Diagnosis not present

## 2021-10-06 DIAGNOSIS — M48061 Spinal stenosis, lumbar region without neurogenic claudication: Secondary | ICD-10-CM | POA: Diagnosis not present

## 2021-10-06 DIAGNOSIS — Z7984 Long term (current) use of oral hypoglycemic drugs: Secondary | ICD-10-CM | POA: Diagnosis not present

## 2021-10-06 DIAGNOSIS — M4310 Spondylolisthesis, site unspecified: Secondary | ICD-10-CM | POA: Diagnosis not present

## 2021-10-06 DIAGNOSIS — M47816 Spondylosis without myelopathy or radiculopathy, lumbar region: Secondary | ICD-10-CM | POA: Diagnosis not present

## 2021-10-06 DIAGNOSIS — I10 Essential (primary) hypertension: Secondary | ICD-10-CM | POA: Diagnosis not present

## 2021-10-06 DIAGNOSIS — F17210 Nicotine dependence, cigarettes, uncomplicated: Secondary | ICD-10-CM | POA: Diagnosis not present

## 2021-10-06 DIAGNOSIS — Z7901 Long term (current) use of anticoagulants: Secondary | ICD-10-CM | POA: Diagnosis not present

## 2021-10-06 DIAGNOSIS — M25511 Pain in right shoulder: Secondary | ICD-10-CM | POA: Diagnosis not present

## 2021-10-06 DIAGNOSIS — E119 Type 2 diabetes mellitus without complications: Secondary | ICD-10-CM | POA: Diagnosis not present

## 2021-10-08 DIAGNOSIS — D75839 Thrombocytosis, unspecified: Secondary | ICD-10-CM | POA: Diagnosis not present

## 2021-10-08 DIAGNOSIS — Z7984 Long term (current) use of oral hypoglycemic drugs: Secondary | ICD-10-CM | POA: Diagnosis not present

## 2021-10-08 DIAGNOSIS — M25512 Pain in left shoulder: Secondary | ICD-10-CM | POA: Diagnosis not present

## 2021-10-08 DIAGNOSIS — I480 Paroxysmal atrial fibrillation: Secondary | ICD-10-CM | POA: Diagnosis not present

## 2021-10-08 DIAGNOSIS — Z7901 Long term (current) use of anticoagulants: Secondary | ICD-10-CM | POA: Diagnosis not present

## 2021-10-08 DIAGNOSIS — K5909 Other constipation: Secondary | ICD-10-CM | POA: Diagnosis not present

## 2021-10-08 DIAGNOSIS — M25511 Pain in right shoulder: Secondary | ICD-10-CM | POA: Diagnosis not present

## 2021-10-08 DIAGNOSIS — E785 Hyperlipidemia, unspecified: Secondary | ICD-10-CM | POA: Diagnosis not present

## 2021-10-08 DIAGNOSIS — M5441 Lumbago with sciatica, right side: Secondary | ICD-10-CM | POA: Diagnosis not present

## 2021-10-08 DIAGNOSIS — I10 Essential (primary) hypertension: Secondary | ICD-10-CM | POA: Diagnosis not present

## 2021-10-08 DIAGNOSIS — M4310 Spondylolisthesis, site unspecified: Secondary | ICD-10-CM | POA: Diagnosis not present

## 2021-10-08 DIAGNOSIS — F17210 Nicotine dependence, cigarettes, uncomplicated: Secondary | ICD-10-CM | POA: Diagnosis not present

## 2021-10-08 DIAGNOSIS — E119 Type 2 diabetes mellitus without complications: Secondary | ICD-10-CM | POA: Diagnosis not present

## 2021-10-08 DIAGNOSIS — M48061 Spinal stenosis, lumbar region without neurogenic claudication: Secondary | ICD-10-CM | POA: Diagnosis not present

## 2021-10-08 DIAGNOSIS — M1990 Unspecified osteoarthritis, unspecified site: Secondary | ICD-10-CM | POA: Diagnosis not present

## 2021-10-08 DIAGNOSIS — M47816 Spondylosis without myelopathy or radiculopathy, lumbar region: Secondary | ICD-10-CM | POA: Diagnosis not present

## 2021-10-15 DIAGNOSIS — M25511 Pain in right shoulder: Secondary | ICD-10-CM | POA: Diagnosis not present

## 2021-10-15 DIAGNOSIS — D75839 Thrombocytosis, unspecified: Secondary | ICD-10-CM | POA: Diagnosis not present

## 2021-10-15 DIAGNOSIS — E119 Type 2 diabetes mellitus without complications: Secondary | ICD-10-CM | POA: Diagnosis not present

## 2021-10-15 DIAGNOSIS — M48061 Spinal stenosis, lumbar region without neurogenic claudication: Secondary | ICD-10-CM | POA: Diagnosis not present

## 2021-10-15 DIAGNOSIS — M25512 Pain in left shoulder: Secondary | ICD-10-CM | POA: Diagnosis not present

## 2021-10-15 DIAGNOSIS — M47816 Spondylosis without myelopathy or radiculopathy, lumbar region: Secondary | ICD-10-CM | POA: Diagnosis not present

## 2021-10-15 DIAGNOSIS — E785 Hyperlipidemia, unspecified: Secondary | ICD-10-CM | POA: Diagnosis not present

## 2021-10-15 DIAGNOSIS — I10 Essential (primary) hypertension: Secondary | ICD-10-CM | POA: Diagnosis not present

## 2021-10-15 DIAGNOSIS — F17210 Nicotine dependence, cigarettes, uncomplicated: Secondary | ICD-10-CM | POA: Diagnosis not present

## 2021-10-15 DIAGNOSIS — M1990 Unspecified osteoarthritis, unspecified site: Secondary | ICD-10-CM | POA: Diagnosis not present

## 2021-10-15 DIAGNOSIS — Z7901 Long term (current) use of anticoagulants: Secondary | ICD-10-CM | POA: Diagnosis not present

## 2021-10-15 DIAGNOSIS — Z7984 Long term (current) use of oral hypoglycemic drugs: Secondary | ICD-10-CM | POA: Diagnosis not present

## 2021-10-15 DIAGNOSIS — I480 Paroxysmal atrial fibrillation: Secondary | ICD-10-CM | POA: Diagnosis not present

## 2021-10-15 DIAGNOSIS — M4310 Spondylolisthesis, site unspecified: Secondary | ICD-10-CM | POA: Diagnosis not present

## 2021-10-15 DIAGNOSIS — M5441 Lumbago with sciatica, right side: Secondary | ICD-10-CM | POA: Diagnosis not present

## 2021-10-15 DIAGNOSIS — K5909 Other constipation: Secondary | ICD-10-CM | POA: Diagnosis not present

## 2021-10-23 DIAGNOSIS — I480 Paroxysmal atrial fibrillation: Secondary | ICD-10-CM | POA: Diagnosis not present

## 2021-10-23 DIAGNOSIS — Z7984 Long term (current) use of oral hypoglycemic drugs: Secondary | ICD-10-CM | POA: Diagnosis not present

## 2021-10-23 DIAGNOSIS — M25511 Pain in right shoulder: Secondary | ICD-10-CM | POA: Diagnosis not present

## 2021-10-23 DIAGNOSIS — M1990 Unspecified osteoarthritis, unspecified site: Secondary | ICD-10-CM | POA: Diagnosis not present

## 2021-10-23 DIAGNOSIS — F17210 Nicotine dependence, cigarettes, uncomplicated: Secondary | ICD-10-CM | POA: Diagnosis not present

## 2021-10-23 DIAGNOSIS — M47816 Spondylosis without myelopathy or radiculopathy, lumbar region: Secondary | ICD-10-CM | POA: Diagnosis not present

## 2021-10-23 DIAGNOSIS — E785 Hyperlipidemia, unspecified: Secondary | ICD-10-CM | POA: Diagnosis not present

## 2021-10-23 DIAGNOSIS — E119 Type 2 diabetes mellitus without complications: Secondary | ICD-10-CM | POA: Diagnosis not present

## 2021-10-23 DIAGNOSIS — D75839 Thrombocytosis, unspecified: Secondary | ICD-10-CM | POA: Diagnosis not present

## 2021-10-23 DIAGNOSIS — M48061 Spinal stenosis, lumbar region without neurogenic claudication: Secondary | ICD-10-CM | POA: Diagnosis not present

## 2021-10-23 DIAGNOSIS — K5909 Other constipation: Secondary | ICD-10-CM | POA: Diagnosis not present

## 2021-10-23 DIAGNOSIS — M5441 Lumbago with sciatica, right side: Secondary | ICD-10-CM | POA: Diagnosis not present

## 2021-10-23 DIAGNOSIS — I10 Essential (primary) hypertension: Secondary | ICD-10-CM | POA: Diagnosis not present

## 2021-10-23 DIAGNOSIS — M25512 Pain in left shoulder: Secondary | ICD-10-CM | POA: Diagnosis not present

## 2021-10-23 DIAGNOSIS — Z7901 Long term (current) use of anticoagulants: Secondary | ICD-10-CM | POA: Diagnosis not present

## 2021-10-23 DIAGNOSIS — M4310 Spondylolisthesis, site unspecified: Secondary | ICD-10-CM | POA: Diagnosis not present

## 2022-05-04 DIAGNOSIS — E119 Type 2 diabetes mellitus without complications: Secondary | ICD-10-CM | POA: Diagnosis not present

## 2022-05-04 DIAGNOSIS — E785 Hyperlipidemia, unspecified: Secondary | ICD-10-CM | POA: Diagnosis not present

## 2022-05-11 DIAGNOSIS — Z0001 Encounter for general adult medical examination with abnormal findings: Secondary | ICD-10-CM | POA: Diagnosis not present

## 2022-08-05 NOTE — Progress Notes (Incomplete)
CARDIOLOGY CONSULT NOTE       Patient ID: Aaron Medina MRN: [id number] DOB/AGE: [birth year] 61 y.o.  Admit date: (Not on file) Referring Physician: Nevada Crane Primary Physician: Celene Squibb, MD Primary Cardiologist: New Reason for Consultation: Afib  Active Problems:   * No active hospital problems. *   HPI:  61 y.o. referred by Dr Nevada Crane for afib. History of DM, HTN, HLD, Smoking and stroke Stroke in 2015 Left hemisphere with residual left UE weakness Sees Dr Krista Blue from neurology for this right MCA stroke He was diagnosed with afib at this time and has been on anticoagulation since he has had progressive gait abnormality being w/u by ortho and neurology he was seen by Dr Nelson/Mclean in 2015 with TTE showing EF 55-60% ? Apical hypokinesis with myovue suggesting small apical infarct no ischemia he was never cathed   ***  ROS All other systems reviewed and negative except as noted above  Past Medical History:  Diagnosis Date  . Diabetes mellitus without complication ([place])   . Hyperlipidemia   . Hypertension   . Stroke ([place]) 2015  . Substance abuse ([place])    Remote  > 20 years ago, cocaine induced vasospasm    Family History  Problem Relation Age of Onset  . Diabetes Mother   . Cancer Mother        pancreas  . Kidney disease Sister   . Diabetes Sister   . Healthy Brother   . Healthy Daughter   . Healthy Sister   . Healthy Sister   . Healthy Brother   . Healthy Brother   . Healthy Brother   . Healthy Brother     Social History   Socioeconomic History  . Marital status: Married    Spouse name: Not on file  . Number of children: 0  . Years of education: 77  . Highest education level: Not on file  Occupational History  . Occupation: Works in shipping/recieving. Forklift driver.  Tobacco Use  . Smoking status: Every Day    Packs/day: 1.50    Years: 35.00    Total pack years: 52.50    Types: Cigarettes  . Smokeless tobacco: Never  Substance and Sexual Activity   . Alcohol use: Yes    Comment: Occasional use, not to excess  . Drug use: No    Comment: Not since the 80s  . Sexual activity: Not on file  Other Topics Concern  . Not on file  Social History Narrative   Lives with wife.   Social Determinants of Health   Financial Resource Strain: Not on file  Food Insecurity: Not on file  Transportation Needs: Not on file  Physical Activity: Not on file  Stress: Not on file  Social Connections: Not on file  Intimate Partner Violence: Not on file    Past Surgical History:  Procedure Laterality Date  . LYMPH NODE BIOPSY        Current Outpatient Medications:  .  atorvastatin (LIPITOR) 10 MG tablet, TAKE 1 TABLET BY MOUTH EVERY DAY AT 6 PM, Disp: 30 tablet, Rfl: 6 .  blood glucose meter kit and supplies KIT, Dispense based on patient and insurance preference. Use to monitor FSBS 1x daily. Dx: E11.9., Disp: 1 each, Rfl: 1 .  Glucose Blood (BLOOD GLUCOSE TEST STRIPS) STRP, Dispense based on patient and insurance preference. Use to monitor FSBS 1x daily. Dx: E11.9., Disp: 50 each, Rfl: 11 .  lisinopril (PRINIVIL,ZESTRIL) 40 MG tablet, Take 1 tablet (40  mg total) by mouth daily., Disp: 90 tablet, Rfl: 3 .  metFORMIN (GLUCOPHAGE) 850 MG tablet, TAKE 1 TABLET BY MOUTH DAILY WITH BREAKFAST., Disp: 30 tablet, Rfl: 6 .  metoprolol succinate (TOPROL-XL) 50 MG 24 hr tablet, TAKE 1 TABLET (50 MG TOTAL) BY MOUTH DAILY. TAKE WITH OR IMMEDIATELY FOLLOWING A MEAL., Disp: 30 tablet, Rfl: 6 .  metoprolol tartrate (LOPRESSOR) 50 MG tablet, Take 50 mg by mouth daily., Disp: , Rfl:  .  multivitamin-iron-minerals-folic acid (CENTRUM) chewable tablet, Chew 1 tablet by mouth at bedtime. , Disp: , Rfl:  .  ONETOUCH DELICA LANCETS 09F MISC, Use to monitor FSBS 1x daily Dx: E11.9, Disp: 100 each, Rfl: 6 .  traMADol (ULTRAM) 50 MG tablet, Take 1 tablet (50 mg total) by mouth every 8 (eight) hours as needed., Disp: 30 tablet, Rfl: 2 .  vitamin C (ASCORBIC ACID) 500 MG  tablet, Take 500 mg by mouth daily., Disp: , Rfl:  .  XARELTO 20 MG TABS tablet, TAKE 1 TABLET BY MOUTH EVERY DAY WITH SUPPER, Disp: 30 tablet, Rfl: 6    Physical Exam: There were no vitals taken for this visit.    Affect appropriate Healthy:  appears stated age 61: normal Neck supple with no adenopathy JVP normal no bruits no thyromegaly Lungs clear with no wheezing and good diaphragmatic motion Heart:  S1/S2 no murmur, no rub, gallop or click PMI normal Abdomen: benighn, BS positve, no tenderness, no AAA no bruit.  No HSM or HJR Distal pulses intact with no bruits No edema Neuro abnormal gait with LUE weakness  Skin warm and dry No muscular weakness   Labs:   Lab Results  Component Value Date   WBC 7.6 12/28/2016   HGB 14.7 12/28/2016   HCT 44.1 12/28/2016   MCV 88.4 12/28/2016   PLT 282 12/28/2016   No results for input(s): "NA", "K", "CL", "CO2", "BUN", "CREATININE", "CALCIUM", "PROT", "BILITOT", "ALKPHOS", "ALT", "AST", "GLUCOSE" in the last 168 hours.  Invalid input(s): "LABALBU" Lab Results  Component Value Date   TROPONINI <0.30 01/13/2014    Lab Results  Component Value Date   CHOL 112 12/28/2016   CHOL 109 (L) 08/02/2016   CHOL 114 (L) 02/15/2016   Lab Results  Component Value Date   HDL 35 (L) 12/28/2016   HDL 32 (L) 08/02/2016   HDL 30 (L) 02/15/2016   Lab Results  Component Value Date   LDLCALC 61 12/28/2016   LDLCALC 62 08/02/2016   LDLCALC 62 02/15/2016   Lab Results  Component Value Date   TRIG 78 12/28/2016   TRIG 77 08/02/2016   TRIG 109 02/15/2016   Lab Results  Component Value Date   CHOLHDL 3.2 12/28/2016   CHOLHDL 3.4 08/02/2016   CHOLHDL 3.8 02/15/2016   No results found for: "LDLDIRECT"    Radiology: No results found.  EKG: ***   ASSESSMENT AND PLAN:   Afib: chronic on xarelto and Toprol rates are ok Update TTE CAD:  risk with abnormal myovue in 2015 *** DM:  Discussed low carb diet.  Target hemoglobin A1c  is 6.5 or less.  Continue current medications. HLD: continue statin  HTN:  continue ACE and beta blocker  TTE Lexiscan Myovue  F/U in a year   Signed: Jenkins Rouge 08/05/2022, 3:08 PM

## 2022-08-15 ENCOUNTER — Ambulatory Visit: Payer: Medicare Other | Admitting: Cardiovascular Disease

## 2022-09-28 ENCOUNTER — Ambulatory Visit: Payer: Medicare Other | Admitting: Cardiology

## 2022-09-28 ENCOUNTER — Encounter: Payer: Self-pay | Admitting: Cardiology

## 2022-09-28 NOTE — Progress Notes (Deleted)
Cardiology Office Note  Date: 09/28/2022   ID: Aaron Medina, DOB 07/10/1961, MRN [id number]  PCP:  Celene Squibb, MD  Cardiologist:  None Electrophysiologist:  None   No chief complaint on file.   History of Present Illness: Aaron Medina is a 61 y.o. male consultation by Ms. Mare Ferrari NP with Dr. Nevada Crane for evaluation of atrial fibrillation.  I reviewed the available records.  He was seen by Dr. Aundra Dubin back in 2015 at which point diagnosis of paroxysmal atrial fibrillation was made based on telemetry assessment during evaluation for stroke around that time.  Prior cardiac history included apparent ACS in the 1980s associated with cocaine use.  He did undergo a cardiac catheterization at [facility] in 1991 that reportedly demonstrated moderate proximal LAD stenosis associated with dissection and possible thrombus that was apparently managed medically based on limited information.  He had a follow-up Myoview in 2015 which was low risk with evidence of apical scar but no ischemia.  Past Medical History:  Diagnosis Date   Acute coronary syndrome ([place])    Occurred in the 1980s in the setting of cocaine use   CAD (coronary artery disease)    Moderate proximal LAD disease associated with dissection and thrombus managed medically at [facility] in 1991   Hyperlipidemia    Hypertension    Lumbar spondylosis    Paroxysmal atrial fibrillation ([place])    Stroke ([place]) 10/10/2013   Type 2 diabetes mellitus ([place])     Past Surgical History:  Procedure Laterality Date   LYMPH NODE BIOPSY      Current Outpatient Medications  Medication Sig Dispense Refill   atorvastatin (LIPITOR) 10 MG tablet TAKE 1 TABLET BY MOUTH EVERY DAY AT 6 PM 30 tablet 6   blood glucose meter kit and supplies KIT Dispense based on patient and insurance preference. Use to monitor FSBS 1x daily. Dx: E11.9. 1 each 1   Glucose Blood (BLOOD GLUCOSE TEST STRIPS) STRP Dispense based on patient and insurance preference. Use to  monitor FSBS 1x daily. Dx: E11.9. 50 each 11   lisinopril (PRINIVIL,ZESTRIL) 40 MG tablet Take 1 tablet (40 mg total) by mouth daily. 90 tablet 3   metFORMIN (GLUCOPHAGE) 850 MG tablet TAKE 1 TABLET BY MOUTH DAILY WITH BREAKFAST. 30 tablet 6   metoprolol succinate (TOPROL-XL) 50 MG 24 hr tablet TAKE 1 TABLET (50 MG TOTAL) BY MOUTH DAILY. TAKE WITH OR IMMEDIATELY FOLLOWING A MEAL. 30 tablet 6   metoprolol tartrate (LOPRESSOR) 50 MG tablet Take 50 mg by mouth daily.     multivitamin-iron-minerals-folic acid (CENTRUM) chewable tablet Chew 1 tablet by mouth at bedtime.      ONETOUCH DELICA LANCETS 38B MISC Use to monitor FSBS 1x daily Dx: E11.9 100 each 6   traMADol (ULTRAM) 50 MG tablet Take 1 tablet (50 mg total) by mouth every 8 (eight) hours as needed. 30 tablet 2   vitamin C (ASCORBIC ACID) 500 MG tablet Take 500 mg by mouth daily.     XARELTO 20 MG TABS tablet TAKE 1 TABLET BY MOUTH EVERY DAY WITH SUPPER 30 tablet 6   No current facility-administered medications for this visit.   Allergies:  Patient has no known allergies.   Social History: The patient  reports that he has been smoking cigarettes. He has a 52.50 pack-year smoking history. He has never used smokeless tobacco. He reports current alcohol use. He reports that he does not use drugs.   Family History: The patient's family history includes Cancer  in his mother; Diabetes in his mother and sister; Healthy in his brother, brother, brother, brother, brother, daughter, sister, and sister; Kidney disease in his sister.   ROS:  Please see the history of present illness. Otherwise, complete review of systems is positive for {NONE DEFAULTED:18576}.  All other systems are reviewed and negative.   Physical Exam: VS:  There were no vitals taken for this visit., BMI There is no height or weight on file to calculate BMI.  Wt Readings from Last 3 Encounters:  08/19/21 200 lb (90.7 kg)  02/25/19 210 lb (95.3 kg)  12/28/16 216 lb (98 kg)     General: Patient appears comfortable at rest. HEENT: Conjunctiva and lids normal, oropharynx clear with moist mucosa. Neck: Supple, no elevated JVP or carotid bruits, no thyromegaly. Lungs: Clear to auscultation, nonlabored breathing at rest. Cardiac: Regular rate and rhythm, no S3 or significant systolic murmur, no pericardial rub. Abdomen: Soft, nontender, no hepatomegaly, bowel sounds present, no guarding or rebound. Extremities: No pitting edema, distal pulses 2+. Skin: Warm and dry. Musculoskeletal: No kyphosis. Neuropsychiatric: Alert and oriented x3, affect grossly appropriate.  ECG:  An ECG dated 01/13/2014 was personally reviewed today and demonstrated:  Sinus rhythm with anterolateral ST-T wave abnormalities suggestive of ischemic heart disease or cardiomyopathy.  Recent Labwork:  July 2023: Hemoglobin A1c 6.2%, cholesterol 150, triglycerides 126, HDL 31, LDL 96, BUN 8, creatinine 1.17, potassium 4.2, AST 11, ALT 13, hemoglobin 13.9, platelets 300  Other Studies Reviewed Today:  Echocardiogram 01/14/2014: Study Conclusions   Left ventricle: The cavity size was normal. Wall thickness  was normal. Systolic function was normal. The estimated  ejection fraction was in the range of 55% to 60%. Wall  motion was normal; there were no regional wall motion  abnormalities.   Impressions:   - This was a limited study with use of echo contrast for the    evaluation of left ventrcicular thrombus. There is no    evidence of an LV thrombus on the images with and without    echo contrast.   Lexiscan Myoview 01/28/2014: Impression Exercise Capacity:  Lexiscan with no exercise. BP Response:  Normal blood pressure response. Clinical Symptoms:  No significant symptoms noted. ECG Impression:  No significant ST segment change suggestive of ischemia. Comparison with Prior Nuclear Study: No images to compare   Overall Impression:  Low risk stress nuclear study with a small dense scar in the  distal wrap around LAD with no evidence of ischemia. .   LV Ejection Fraction: 44%.  LV Wall Motion:  Akinesis of the distal inferior wall and the apex.   Assessment and Plan:   Medication Adjustments/Labs and Tests Ordered: Current medicines are reviewed at length with the patient today.  Concerns regarding medicines are outlined above.   Tests Ordered: No orders of the defined types were placed in this encounter.   Medication Changes: No orders of the defined types were placed in this encounter.   Disposition:  Follow up {follow up:15908}  Signed, Satira Sark, MD, [facility] 09/28/2022 1:03 PM    [facility] at [facility] [place], [place] [address] Phone: [phone]; Fax: [phone]

## 2022-09-29 ENCOUNTER — Encounter: Payer: Self-pay | Admitting: Cardiology

## 2023-01-16 DIAGNOSIS — I10 Essential (primary) hypertension: Secondary | ICD-10-CM | POA: Diagnosis not present

## 2023-01-16 DIAGNOSIS — E119 Type 2 diabetes mellitus without complications: Secondary | ICD-10-CM | POA: Diagnosis not present

## 2023-01-16 DIAGNOSIS — E785 Hyperlipidemia, unspecified: Secondary | ICD-10-CM | POA: Diagnosis not present

## 2023-01-20 DIAGNOSIS — I10 Essential (primary) hypertension: Secondary | ICD-10-CM | POA: Diagnosis not present

## 2023-01-20 DIAGNOSIS — E785 Hyperlipidemia, unspecified: Secondary | ICD-10-CM | POA: Diagnosis not present

## 2023-01-20 DIAGNOSIS — E1165 Type 2 diabetes mellitus with hyperglycemia: Secondary | ICD-10-CM | POA: Diagnosis not present

## 2023-01-20 DIAGNOSIS — M545 Low back pain, unspecified: Secondary | ICD-10-CM | POA: Diagnosis not present

## 2023-01-20 DIAGNOSIS — M47896 Other spondylosis, lumbar region: Secondary | ICD-10-CM | POA: Diagnosis not present

## 2023-02-23 ENCOUNTER — Ambulatory Visit (HOSPITAL_COMMUNITY): Payer: Medicare Other | Admitting: Physical Therapy

## 2023-07-18 DIAGNOSIS — E1165 Type 2 diabetes mellitus with hyperglycemia: Secondary | ICD-10-CM | POA: Diagnosis not present

## 2023-07-18 DIAGNOSIS — I10 Essential (primary) hypertension: Secondary | ICD-10-CM | POA: Diagnosis not present

## 2023-09-06 ENCOUNTER — Other Ambulatory Visit (HOSPITAL_COMMUNITY): Payer: Self-pay | Admitting: Family Medicine

## 2023-09-06 DIAGNOSIS — M47896 Other spondylosis, lumbar region: Secondary | ICD-10-CM | POA: Diagnosis not present

## 2023-09-06 DIAGNOSIS — E1165 Type 2 diabetes mellitus with hyperglycemia: Secondary | ICD-10-CM | POA: Diagnosis not present

## 2023-09-06 DIAGNOSIS — E785 Hyperlipidemia, unspecified: Secondary | ICD-10-CM | POA: Diagnosis not present

## 2023-09-06 DIAGNOSIS — M47816 Spondylosis without myelopathy or radiculopathy, lumbar region: Secondary | ICD-10-CM | POA: Diagnosis not present

## 2023-09-06 DIAGNOSIS — I10 Essential (primary) hypertension: Secondary | ICD-10-CM | POA: Diagnosis not present

## 2023-09-06 DIAGNOSIS — F17200 Nicotine dependence, unspecified, uncomplicated: Secondary | ICD-10-CM

## 2023-09-06 DIAGNOSIS — Z87891 Personal history of nicotine dependence: Secondary | ICD-10-CM

## 2023-09-28 ENCOUNTER — Encounter (HOSPITAL_COMMUNITY): Payer: Self-pay

## 2023-09-28 ENCOUNTER — Ambulatory Visit (HOSPITAL_COMMUNITY): Admission: RE | Admit: 2023-09-28 | Payer: Medicare Other | Source: Ambulatory Visit

## 2024-02-29 DIAGNOSIS — E1165 Type 2 diabetes mellitus with hyperglycemia: Secondary | ICD-10-CM | POA: Diagnosis not present

## 2024-02-29 DIAGNOSIS — I10 Essential (primary) hypertension: Secondary | ICD-10-CM | POA: Diagnosis not present

## 2024-03-06 DIAGNOSIS — E785 Hyperlipidemia, unspecified: Secondary | ICD-10-CM | POA: Diagnosis not present

## 2024-03-06 DIAGNOSIS — I10 Essential (primary) hypertension: Secondary | ICD-10-CM | POA: Diagnosis not present

## 2024-03-06 DIAGNOSIS — Z0001 Encounter for general adult medical examination with abnormal findings: Secondary | ICD-10-CM | POA: Diagnosis not present

## 2024-03-06 DIAGNOSIS — R531 Weakness: Secondary | ICD-10-CM | POA: Diagnosis not present

## 2024-03-06 DIAGNOSIS — M47896 Other spondylosis, lumbar region: Secondary | ICD-10-CM | POA: Diagnosis not present

## 2024-03-06 DIAGNOSIS — E1165 Type 2 diabetes mellitus with hyperglycemia: Secondary | ICD-10-CM | POA: Diagnosis not present

## 2024-03-18 ENCOUNTER — Encounter (HOSPITAL_COMMUNITY): Payer: Self-pay

## 2024-03-18 ENCOUNTER — Ambulatory Visit (HOSPITAL_COMMUNITY): Attending: Family Medicine

## 2024-03-18 ENCOUNTER — Other Ambulatory Visit: Payer: Self-pay

## 2024-03-18 DIAGNOSIS — Z7409 Other reduced mobility: Secondary | ICD-10-CM | POA: Diagnosis not present

## 2024-03-18 DIAGNOSIS — R29898 Other symptoms and signs involving the musculoskeletal system: Secondary | ICD-10-CM | POA: Diagnosis not present

## 2024-03-18 NOTE — Therapy (Signed)
 OUTPATIENT PHYSICAL THERAPY LOWER EXTREMITY EVALUATION   Patient Name: Aaron Medina MRN: [id number] DOB:November 15, 1960, 63 y.o., male Today's Date: 03/18/2024  END OF SESSION:  PT End of Session - 03/18/24 1519     Visit Number 1    Date for PT Re-Evaluation 04/29/24    Authorization Type Medicare a&B    Progress Note Due on Visit 10    PT Start Time 1439    PT Stop Time 1517    PT Time Calculation (min) 38 min    Activity Tolerance Patient tolerated treatment well    Behavior During Therapy WFL for tasks assessed/performed             Past Medical History:  Diagnosis Date   Acute coronary syndrome ([facility])    Occurred in the 1980s in the setting of cocaine use   CAD (coronary artery disease)    Moderate proximal LAD disease associated with dissection and thrombus managed medically at [facility] in 1991   Hyperlipidemia    Hypertension    Lumbar spondylosis    Paroxysmal atrial fibrillation ([facility])    Stroke ([facility]) 10/10/2013   Type 2 diabetes mellitus ([facility])    Past Surgical History:  Procedure Laterality Date   LYMPH NODE BIOPSY     Patient Active Problem List   Diagnosis Date Noted   Cerebrovascular accident (CVA) ([facility]) 08/19/2021   Gait abnormality 08/19/2021   Right leg weakness 02/25/2019   Chronic midline low back pain with right-sided sciatica 02/25/2019   Spondylolisthesis 11/12/2014   Type 2 diabetes mellitus without complication (HCC) 07/29/2014   Embolic stroke involving right middle cerebral artery (HCC) 07/29/2014   PAF (paroxysmal atrial fibrillation) (HCC) 01/13/2014   History of CVA (cerebrovascular accident) 01/10/2014   OA (osteoarthritis) 06/23/2013   Thrombocytosis 03/21/2013   Essential hypertension, benign 03/18/2013   Hyperlipidemia 03/18/2013   Tobacco use 03/18/2013   Shoulder pain, bilateral 03/18/2013    PCP: Omie Bickers, MD  REFERRING PROVIDER: Wendi Ham, NP  REFERRING DIAG: Right leg weakness  THERAPY DIAG:  Right leg  weakness  Impaired functional mobility, balance, gait, and endurance  Rationale for Evaluation and Treatment: Rehabilitation  ONSET DATE: "long time"  SUBJECTIVE:   SUBJECTIVE STATEMENT: Recent onset within 2-3 years, pt's RLE weakness has gotten worse. Prolonged Lumbar back issues that MD recommended surgery years ago but pt did not go through with it. Pt's primary complaint in RLE weakness and R hip pain. Major difficulty with stair negotiation.   PERTINENT HISTORY: Stroke/CVA about 10 years ago Lumbar disc L4-L5 protrusion  Diabetic PAIN:  Are you having pain? No "general aches and pains"  PRECAUTIONS: None  RED FLAGS: None   WEIGHT BEARING RESTRICTIONS: No  FALLS:  Has patient fallen in last 6 months? No  PATIENT GOALS: "I want to gain more manuveurability"   OBJECTIVE:  Note: Objective measures were completed at Evaluation unless otherwise noted.  DIAGNOSTIC FINDINGS:  MRI of lumbar from [facility] on April 21, 2021, mild L4-5 spinal stenosis, small to moderate left median directly synovial cyst contents upon left lateral thecal sac, with mild ventral displacement of the descending L4 nerve roots, moderate severe bilateral neuroforaminal narrowing impinging upon and displacing bilateral L4 nerve roots. Moderate to severe right and mild to moderate left L5-S1 neuroforaminal narrowing, impinging upon the right lateral V nerve roots, without spinal stenosis, L3-4, no significant canal or foraminal stenosis multilevel degenerative changes   PATIENT SURVEYS:  LEFS: 16/80 = 20%  COGNITION: Overall cognitive status:  Within functional limits for tasks assessed     SENSATION: Light touch: Impaired  and Decreased light touch in dermatomal pattern L3-4 Proprioception: WFL  POSTURE: decreased lumbar lordosis and flexed trunk   PALPATION:   LOWER EXTREMITY ROM:  Active ROM Right eval Left eval  Hip flexion    Hip extension    Hip abduction    Hip adduction     Hip internal rotation    Hip external rotation    Knee flexion    Knee extension    Ankle dorsiflexion    Ankle plantarflexion    Ankle inversion    Ankle eversion     (Blank rows = not tested)  LOWER EXTREMITY MMT:  MMT Right eval Left eval  Hip flexion 2+ 3+  Hip extension    Hip abduction 3 4-  Hip adduction    Hip internal rotation    Hip external rotation    Knee flexion 3- 4-  Knee extension 3+ 4+  Ankle dorsiflexion 0 4-  Ankle plantarflexion    Ankle inversion    Ankle eversion     (Blank rows = not tested)   FUNCTIONAL TESTS:  Timed up and go (TUG): 63 seconds w/ RW  30 Second Chair Stand Test: 5x  Norms:   Age 64-64 18-69 70-74 75-79 75-84 85-89 90-94  Women 15 15 14 13 12 11 9   Men 17 16 15 14 13 11 9    L SLS: 4.49 seconds  R SLS: 1.38 seconds  Norms: 18-39  F: 43.5 seconds  M: 43.2 seconds 40-49  F: 40.4 seconds  M: 40.1 seconds 50-59  F: 36 seconds  M: 38.1 seconds 60-69  F: 25.1 seconds  M: 28.7 seconds 70-79  F: 11.3 seconds  M: 18.3 seconds  GAIT: Distance walked: 4ft Assistive device utilized: bilateral single point cane Level of assistance: SBA Comments: Bilateral single-point cane ambulation with step to pattern increased forward trunk flexion.                                                                                                                                TREATMENT DATE:  03/18/2024 PT evaluation - Education time spent on patient acquiring rolling walker due to significantly unsteady gait and reduced balance deficits.  Patient also educated on increasing activity tolerance and walking more consistently throughout the day.  Education time spent talking about potential orthotic intervention to reduce foot drop and educate on AFO.  PATIENT EDUCATION:  Education details: PT Evaluation, findings, prognosis, frequency, attendance policy, and requiring rolling walker for ambulation and increasing walking time  throughout the day.. Person educated: Patient Education method: Medical illustrator Education comprehension: verbalized understanding  HOME EXERCISE PROGRAM: To initiate neck session  ASSESSMENT:  CLINICAL IMPRESSION: Patient is a 62y.o. male who was seen today for physical therapy evaluation and treatment for ' right left weakness.'  Patient with history of low back pain, and right  sided MCA.  Patient reporting new onset of right lower extremity muscle weakness and muscle atrophy impacting capacity for movement.  Patient shows significant right lower extremity weakness compared to left side with right foot drop noted.  Patient wants to get stronger patient with significant functional mobility deficits impairing endurance, fall risk, transfer capacity, and ADLs due to bilateral lower extremity muscle weakness, right side greater than left, significant balance deficits, reduced postural and balance reactions, reduced ankle range of motion, reduced light touch sensation. Pt will benefit from skilled Physical Therapy services to address deficits/limitations in order to improve functional and QOL.    OBJECTIVE IMPAIRMENTS: Abnormal gait, decreased activity tolerance, decreased balance, decreased endurance, decreased mobility, difficulty walking, decreased ROM, decreased strength, impaired flexibility, impaired sensation, improper body mechanics, and postural dysfunction.   ACTIVITY LIMITATIONS: carrying, lifting, sitting, standing, squatting, stairs, transfers, bed mobility, and locomotion level  PARTICIPATION LIMITATIONS: driving, shopping, community activity, occupation, yard work, school, and church  PERSONAL FACTORS: Age and 3+ comorbidities: see EMR for details.  are also affecting patient's functional outcome.   REHAB POTENTIAL: Fair hx of stroke and L4-5 Spinal Stenosis  CLINICAL DECISION MAKING: Evolving/moderate complexity  EVALUATION COMPLEXITY: Low   GOALS: Goals  reviewed with patient? No  SHORT TERM GOALS: Target date: 04/08/24  Pt will be independent with HEP in order to demonstrate participation in Physical Therapy POC.  Baseline: Goal status: INITIAL  2.  Pt will improve LEFS score by 10 %  in order to demonstrate improved pain with functional goals and outcomes. Baseline:  Goal status: INITIAL  LONG TERM GOALS: Target date: 04/29/24  Pt will improve 30 Second Chair Stand Test by at least 2 reps in order to demonstrate improved functional strength to return to desired activities.  Baseline: see objective.  Goal status: INITIAL  2.  Pt will improve TUG by at least 10 seconds in order to demonstrate improved functional ambulatory capacity in community setting.  Baseline: see objective.  Goal status: INITIAL  3.  Pt will improve LEFS score by 20% in order to demonstrate improved pain with functional goals and outcomes. Baseline: see objective.  Goal status: INITIAL  4.  Pt will improve BLE MMT by at least 1/2 grade in order to improve safety during functional activities.. Baseline: see objective.  Goal status: INITIAL  5.  Pt will tolerate at least 3 minutes of ambulation at modified independent level to demonstrate improved functional mobility, endurance, and safety.  Baseline: See objective.  Goal status: Initial   PLAN:  PT FREQUENCY: 2x/week  PT DURATION: 6 weeks  PLANNED INTERVENTIONS: 97164- PT Re-evaluation, 97750- Physical Performance Testing, 97110-Therapeutic exercises, 97530- Therapeutic activity, [id number]- Neuromuscular re-education, 97535- Self Care, 60454- Manual therapy, [id number]- Gait training, [phone]- Orthotic Initial, [phone]- Prosthetic Initial , [phone]- Orthotic/Prosthetic subsequent, [id number]- Electrical stimulation (unattended), [phone]- Electrical stimulation (manual), [phone] (1-2 muscles), 20561 (3+ muscles)- Dry Needling, Patient/Family education, Balance training, Stair training, Joint mobilization, Joint manipulation, Spinal  manipulation, Spinal mobilization, and DME instructions  PLAN FOR NEXT SESSION: Functional mobility, balance, bilateral extremity strengthening, potential e-stim for ankle dorsiflexion on R side.   Gatha Kaska PT, DPT [facility] [phone] office  Gatha Kaska, PT 03/18/2024, 3:19 PM

## 2024-03-28 ENCOUNTER — Ambulatory Visit (HOSPITAL_COMMUNITY)

## 2024-03-28 ENCOUNTER — Encounter (HOSPITAL_COMMUNITY): Payer: Self-pay

## 2024-03-28 DIAGNOSIS — Z7409 Other reduced mobility: Secondary | ICD-10-CM | POA: Diagnosis not present

## 2024-03-28 DIAGNOSIS — R29898 Other symptoms and signs involving the musculoskeletal system: Secondary | ICD-10-CM | POA: Diagnosis not present

## 2024-03-28 NOTE — Therapy (Signed)
 OUTPATIENT PHYSICAL THERAPY LOWER EXTREMITY TREATMENT   Patient Name: Aaron Medina MRN: [id number] DOB:30-Oct-1960, 63 y.o., male Today's Date: 03/28/2024  END OF SESSION:  PT End of Session - 03/28/24 1700     Visit Number 2    Number of Visits 12    Date for PT Re-Evaluation 04/29/24    Authorization Type Medicare a&B    Authorization Time Period No auth, no limit    Progress Note Due on Visit 10    PT Start Time 1652    PT Stop Time 1735    PT Time Calculation (min) 43 min    Activity Tolerance Patient tolerated treatment well    Behavior During Therapy WFL for tasks assessed/performed          Past Medical History:  Diagnosis Date   Acute coronary syndrome (HCC)    Occurred in the 1980s in the setting of cocaine use   CAD (coronary artery disease)    Moderate proximal LAD disease associated with dissection and thrombus managed medically at [facility] in 1991   Hyperlipidemia    Hypertension    Lumbar spondylosis    Paroxysmal atrial fibrillation (HCC)    Stroke (HCC) 10/10/2013   Type 2 diabetes mellitus (HCC)    Past Surgical History:  Procedure Laterality Date   LYMPH NODE BIOPSY     Patient Active Problem List   Diagnosis Date Noted   Cerebrovascular accident (CVA) (HCC) 08/19/2021   Gait abnormality 08/19/2021   Right leg weakness 02/25/2019   Chronic midline low back pain with right-sided sciatica 02/25/2019   Spondylolisthesis 11/12/2014   Type 2 diabetes mellitus without complication (HCC) 07/29/2014   Embolic stroke involving right middle cerebral artery (HCC) 07/29/2014   PAF (paroxysmal atrial fibrillation) (HCC) 01/13/2014   History of CVA (cerebrovascular accident) 01/10/2014   OA (osteoarthritis) 06/23/2013   Thrombocytosis 03/21/2013   Essential hypertension, benign 03/18/2013   Hyperlipidemia 03/18/2013   Tobacco use 03/18/2013   Shoulder pain, bilateral 03/18/2013    PCP: Omie Bickers, MD  REFERRING PROVIDER: Wendi Ham,  NP  REFERRING DIAG: Right leg weakness  THERAPY DIAG:  Right leg weakness  Impaired functional mobility, balance, gait, and endurance  Rationale for Evaluation and Treatment: Rehabilitation  ONSET DATE: long time  SUBJECTIVE:   SUBJECTIVE STATEMENT: Pt reports he is feeling okay today. Reports he feels like he just needs to get his strength back. Ambulates with SPC one UE and walking stick other UE.    Recent onset within 2-3 years, pt's RLE weakness has gotten worse. Prolonged Lumbar back issues that MD recommended surgery years ago but pt did not go through with it. Pt's primary complaint in RLE weakness and R hip pain. Major difficulty with stair negotiation.   PERTINENT HISTORY: Stroke/CVA about 10 years ago Lumbar disc L4-L5 protrusion  Diabetic PAIN:  Are you having pain? No general aches and pains  PRECAUTIONS: None  RED FLAGS: None   WEIGHT BEARING RESTRICTIONS: No  FALLS:  Has patient fallen in last 6 months? No  PATIENT GOALS: I want to gain more manuveurability   OBJECTIVE:  Note: Objective measures were completed at Evaluation unless otherwise noted.  DIAGNOSTIC FINDINGS:  MRI of lumbar from [facility] on April 21, 2021, mild L4-5 spinal stenosis, small to moderate left median directly synovial cyst contents upon left lateral thecal sac, with mild ventral displacement of the descending L4 nerve roots, moderate severe bilateral neuroforaminal narrowing impinging upon and displacing bilateral L4 nerve roots. Moderate  to severe right and mild to moderate left L5-S1 neuroforaminal narrowing, impinging upon the right lateral V nerve roots, without spinal stenosis, L3-4, no significant canal or foraminal stenosis multilevel degenerative changes   PATIENT SURVEYS:  LEFS: 16/80 = 20%  COGNITION: Overall cognitive status: Within functional limits for tasks assessed     SENSATION: Light touch: Impaired  and Decreased light touch in dermatomal pattern  L3-4 Proprioception: WFL  POSTURE: decreased lumbar lordosis and flexed trunk   PALPATION:   LOWER EXTREMITY ROM:  Active ROM Right eval Left eval  Hip flexion    Hip extension    Hip abduction    Hip adduction    Hip internal rotation    Hip external rotation    Knee flexion    Knee extension    Ankle dorsiflexion    Ankle plantarflexion    Ankle inversion    Ankle eversion     (Blank rows = not tested)  LOWER EXTREMITY MMT:  MMT Right eval Left eval  Hip flexion 2+ 3+  Hip extension    Hip abduction 3 4-  Hip adduction    Hip internal rotation    Hip external rotation    Knee flexion 3- 4-  Knee extension 3+ 4+  Ankle dorsiflexion 0 4-  Ankle plantarflexion    Ankle inversion    Ankle eversion     (Blank rows = not tested)   FUNCTIONAL TESTS:  Timed up and go (TUG): 63 seconds w/ RW  30 Second Chair Stand Test: 5x  Norms:   Age 16-64 57-69 70-74 75-79 55-84 85-89 90-94  Women 15 15 14 13 12 11 9   Men 17 16 15 14 13 11 9    L SLS: 4.49 seconds  R SLS: 1.38 seconds  Norms: 18-39  F: 43.5 seconds  M: 43.2 seconds 40-49  F: 40.4 seconds  M: 40.1 seconds 50-59  F: 36 seconds  M: 38.1 seconds 60-69  F: 25.1 seconds  M: 28.7 seconds 70-79  F: 11.3 seconds  M: 18.3 seconds  GAIT: Distance walked: 14ft Assistive device utilized: bilateral single point cane Level of assistance: SBA Comments: Bilateral single-point cane ambulation with step to pattern increased forward trunk flexion.                                                                                                                                TREATMENT DATE:  03/28/24 Review of goals Gait training: 38 ft w/ RW, 76 ft w/ RW Seated LAQ, 2x10, pt w/ tendency to hold onto R quad during activity, instructed to allow muscles to do all the work STS, w RW support, 10x, pt demo hyperext of R knee, verbal and tactile cueing blocking at knees to prevent Clamshell, RLE only,  10x Bridge, 10x, PT block R foot, v cues to limit R knee fall out throughout   03/18/2024 PT evaluation - Education time spent on patient acquiring rolling  walker due to significantly unsteady gait and reduced balance deficits.  Patient also educated on increasing activity tolerance and walking more consistently throughout the day.  Education time spent talking about potential orthotic intervention to reduce foot drop and educate on AFO.  PATIENT EDUCATION:  Education details: PT Evaluation, findings, prognosis, frequency, attendance policy, and requiring rolling walker for ambulation and increasing walking time throughout the day.. Person educated: Patient Education method: Medical illustrator Education comprehension: verbalized understanding  HOME EXERCISE PROGRAM: Access Code: [email or web address] URL: [email or web address] Date: 03/28/2024 Prepared by: Virgia Griffins Powell-Butler  Exercises - Seated Long Arc Quad  - 2 x daily - 7 x weekly - 3 sets - 10 reps - Sit to Stand with Arm Reach Toward Target  - 2 x daily - 7 x weekly - 3 sets - 10 reps - Clamshell  - 2 x daily - 7 x weekly - 3 sets - 10 reps - Supine Bridge  - 2 x daily - 7 x weekly - 3 sets - 10 reps  ASSESSMENT:  CLINICAL IMPRESSION: Session began with review of goals. Patient ambulates into session with support of SPC and walking stick, demo dec velocity, unsteadiness, and RLE circumduction. Gait training with RW improves steadiness and gait speed. Patient educated on recommendation of RW for increased support at this time. Given info about Dancing Goat which patient reports he is familiar. Patient reluctant but seems interested after further encouragement to see RW as just a current tool for increasing daily walking, not a permanent solution. Discussion about hip stability, as patient curious about a possible belt for hips. Education on preference to try to strengthen hips prior to using an aid at this time. Pt given  HEP. Verbal and tactile cueing throughout for form, blocking at knees and foot to protect joint (see above). Patient will benefit from continued skilled physical therapy in order to address R sided weakness, general weakness, endurance, and balance to improve function/QOL.     Patient is a 62y.o. male who was seen today for physical therapy evaluation and treatment for ' right left weakness.'  Patient with history of low back pain, and right sided MCA.  Patient reporting new onset of right lower extremity muscle weakness and muscle atrophy impacting capacity for movement.  Patient shows significant right lower extremity weakness compared to left side with right foot drop noted.  Patient wants to get stronger patient with significant functional mobility deficits impairing endurance, fall risk, transfer capacity, and ADLs due to bilateral lower extremity muscle weakness, right side greater than left, significant balance deficits, reduced postural and balance reactions, reduced ankle range of motion, reduced light touch sensation. Pt will benefit from skilled Physical Therapy services to address deficits/limitations in order to improve functional and QOL.    OBJECTIVE IMPAIRMENTS: Abnormal gait, decreased activity tolerance, decreased balance, decreased endurance, decreased mobility, difficulty walking, decreased ROM, decreased strength, impaired flexibility, impaired sensation, improper body mechanics, and postural dysfunction.   ACTIVITY LIMITATIONS: carrying, lifting, sitting, standing, squatting, stairs, transfers, bed mobility, and locomotion level  PARTICIPATION LIMITATIONS: driving, shopping, community activity, occupation, yard work, school, and church  PERSONAL FACTORS: Age and 3+ comorbidities: see EMR for details.  are also affecting patient's functional outcome.   REHAB POTENTIAL: Fair hx of stroke and L4-5 Spinal Stenosis  CLINICAL DECISION MAKING: Evolving/moderate complexity  EVALUATION  COMPLEXITY: Low   GOALS: Goals reviewed with patient? Yes  SHORT TERM GOALS: Target date: 04/08/24  Pt will be independent with HEP in order to demonstrate  participation in Physical Therapy POC.  Baseline: Goal status: INITIAL  2.  Pt will improve LEFS score by 10 %  in order to demonstrate improved pain with functional goals and outcomes. Baseline:  Goal status: INITIAL  LONG TERM GOALS: Target date: 04/29/24  Pt will improve 30 Second Chair Stand Test by at least 2 reps in order to demonstrate improved functional strength to return to desired activities.  Baseline: see objective.  Goal status: INITIAL  2.  Pt will improve TUG by at least 10 seconds in order to demonstrate improved functional ambulatory capacity in community setting.  Baseline: see objective.  Goal status: INITIAL  3.  Pt will improve LEFS score by 20% in order to demonstrate improved pain with functional goals and outcomes. Baseline: see objective.  Goal status: INITIAL  4.  Pt will improve BLE MMT by at least 1/2 grade in order to improve safety during functional activities.. Baseline: see objective.  Goal status: INITIAL  5.  Pt will tolerate at least 3 minutes of ambulation at modified independent level to demonstrate improved functional mobility, endurance, and safety.  Baseline: See objective.  Goal status: Initial   PLAN:  PT FREQUENCY: 2x/week  PT DURATION: 6 weeks  PLANNED INTERVENTIONS: [id number]- PT Re-evaluation, 97750- Physical Performance Testing, 97110-Therapeutic exercises, 97530- Therapeutic activity, [id number]- Neuromuscular re-education, 97535- Self Care, [id number]- Manual therapy, [id number]- Gait training, [phone]- Orthotic Initial, [phone]- Prosthetic Initial , [phone]- Orthotic/Prosthetic subsequent, [id number]- Electrical stimulation (unattended), [phone]- Electrical stimulation (manual), [phone] (1-2 muscles), [id number] (3+ muscles)- Dry Needling, Patient/Family education, Balance training, Stair training, Joint  mobilization, Joint manipulation, Spinal manipulation, Spinal mobilization, and DME instructions  PLAN FOR NEXT SESSION: Functional mobility, balance, bilateral extremity strengthening, potential e-stim for ankle dorsiflexion on R side., review HEP    5:39 PM, 03/28/24 Marysue Sola, PT, DPT [facility] [place]

## 2024-04-03 ENCOUNTER — Encounter (HOSPITAL_COMMUNITY)

## 2024-04-08 ENCOUNTER — Encounter (HOSPITAL_COMMUNITY): Payer: Self-pay

## 2024-04-08 ENCOUNTER — Ambulatory Visit (HOSPITAL_COMMUNITY)

## 2024-04-08 DIAGNOSIS — Z7409 Other reduced mobility: Secondary | ICD-10-CM

## 2024-04-08 DIAGNOSIS — R29898 Other symptoms and signs involving the musculoskeletal system: Secondary | ICD-10-CM

## 2024-04-08 NOTE — Therapy (Signed)
 OUTPATIENT PHYSICAL THERAPY LOWER EXTREMITY TREATMENT   Patient Name: Aaron Medina MRN: [id number] DOB:1961/07/09, 63 y.o., male Today's Date: 04/08/2024  END OF SESSION:  PT End of Session - 04/08/24 1612     Visit Number 3    Number of Visits 12    Date for PT Re-Evaluation 04/29/24    Authorization Type Medicare a&B    Authorization Time Period No auth, no limit    Progress Note Due on Visit 10    PT Start Time 1607    PT Stop Time 1645    PT Time Calculation (min) 38 min    Activity Tolerance Patient tolerated treatment well    Behavior During Therapy WFL for tasks assessed/performed          Past Medical History:  Diagnosis Date   Acute coronary syndrome (HCC)    Occurred in the 1980s in the setting of cocaine use   CAD (coronary artery disease)    Moderate proximal LAD disease associated with dissection and thrombus managed medically at [facility] in 1991   Hyperlipidemia    Hypertension    Lumbar spondylosis    Paroxysmal atrial fibrillation (HCC)    Stroke (HCC) 10/10/2013   Type 2 diabetes mellitus (HCC)    Past Surgical History:  Procedure Laterality Date   LYMPH NODE BIOPSY     Patient Active Problem List   Diagnosis Date Noted   Cerebrovascular accident (CVA) (HCC) 08/19/2021   Gait abnormality 08/19/2021   Right leg weakness 02/25/2019   Chronic midline low back pain with right-sided sciatica 02/25/2019   Spondylolisthesis 11/12/2014   Type 2 diabetes mellitus without complication (HCC) 07/29/2014   Embolic stroke involving right middle cerebral artery (HCC) 07/29/2014   PAF (paroxysmal atrial fibrillation) (HCC) 01/13/2014   History of CVA (cerebrovascular accident) 01/10/2014   OA (osteoarthritis) 06/23/2013   Thrombocytosis 03/21/2013   Essential hypertension, benign 03/18/2013   Hyperlipidemia 03/18/2013   Tobacco use 03/18/2013   Shoulder pain, bilateral 03/18/2013    PCP: Shona Norleen PEDLAR, MD  REFERRING PROVIDER: Hyacinth Honey,  NP  REFERRING DIAG: Right leg weakness  THERAPY DIAG:  Right leg weakness  Impaired functional mobility, balance, gait, and endurance  Rationale for Evaluation and Treatment: Rehabilitation  ONSET DATE: long time  SUBJECTIVE:   SUBJECTIVE STATEMENT: Pt reports he has been sore the past few days from HEP and doing more around the house. Reports sister passed and they had to clean her place some. Reports 5/10 soreness this date    Recent onset within 2-3 years, pt's RLE weakness has gotten worse. Prolonged Lumbar back issues that MD recommended surgery years ago but pt did not go through with it. Pt's primary complaint in RLE weakness and R hip pain. Major difficulty with stair negotiation.   PERTINENT HISTORY: Stroke/CVA about 10 years ago Lumbar disc L4-L5 protrusion  Diabetic PAIN:  Are you having pain? No general aches and pains  PRECAUTIONS: None  RED FLAGS: None   WEIGHT BEARING RESTRICTIONS: No  FALLS:  Has patient fallen in last 6 months? No  PATIENT GOALS: I want to gain more manuveurability   OBJECTIVE:  Note: Objective measures were completed at Evaluation unless otherwise noted.  DIAGNOSTIC FINDINGS:  MRI of lumbar from [facility] on April 21, 2021, mild L4-5 spinal stenosis, small to moderate left median directly synovial cyst contents upon left lateral thecal sac, with mild ventral displacement of the descending L4 nerve roots, moderate severe bilateral neuroforaminal narrowing impinging upon and displacing  bilateral L4 nerve roots. Moderate to severe right and mild to moderate left L5-S1 neuroforaminal narrowing, impinging upon the right lateral V nerve roots, without spinal stenosis, L3-4, no significant canal or foraminal stenosis multilevel degenerative changes   PATIENT SURVEYS:  LEFS: 16/80 = 20%  COGNITION: Overall cognitive status: Within functional limits for tasks assessed     SENSATION: Light touch: Impaired  and Decreased light touch  in dermatomal pattern L3-4 Proprioception: WFL  POSTURE: decreased lumbar lordosis and flexed trunk   PALPATION:   LOWER EXTREMITY ROM:  Active ROM Right eval Left eval  Hip flexion    Hip extension    Hip abduction    Hip adduction    Hip internal rotation    Hip external rotation    Knee flexion    Knee extension    Ankle dorsiflexion    Ankle plantarflexion    Ankle inversion    Ankle eversion     (Blank rows = not tested)  LOWER EXTREMITY MMT:  MMT Right eval Left eval  Hip flexion 2+ 3+  Hip extension    Hip abduction 3 4-  Hip adduction    Hip internal rotation    Hip external rotation    Knee flexion 3- 4-  Knee extension 3+ 4+  Ankle dorsiflexion 0 4-  Ankle plantarflexion    Ankle inversion    Ankle eversion     (Blank rows = not tested)   FUNCTIONAL TESTS:  Timed up and go (TUG): 63 seconds w/ RW  30 Second Chair Stand Test: 5x  Norms:   Age 46-64 29-69 70-74 75-79 80-84 85-89 90-94  Women 15 15 14 13 12 11 9   Men 17 16 15 14 13 11 9    L SLS: 4.49 seconds  R SLS: 1.38 seconds  Norms: 18-39  F: 43.5 seconds  M: 43.2 seconds 40-49  F: 40.4 seconds  M: 40.1 seconds 50-59  F: 36 seconds  M: 38.1 seconds 60-69  F: 25.1 seconds  M: 28.7 seconds 70-79  F: 11.3 seconds  M: 18.3 seconds  GAIT: Distance walked: 3ft Assistive device utilized: bilateral single point cane Level of assistance: SBA Comments: Bilateral single-point cane ambulation with step to pattern increased forward trunk flexion.                                                                                                                                TREATMENT DATE:  04/08/24:  Gait training: 76 ft x2 w/ RW Seated LAQ, 2x10 Positive DF clonus, mod-severe SKTC, R and L, 35 x3 each side DKTC: 3x35 Left side-lying over foam roll, + clamshell, 10x, PT providing stabilization at hip  03/28/24 Review of goals Gait training: 38 ft w/ RW, 76 ft w/ RW Seated  LAQ, 2x10, pt w/ tendency to hold onto R quad during activity, instructed to allow muscles to do all the work STS, w RW support, 10x, pt demo  hyperext of R knee, verbal and tactile cueing blocking at knees to prevent Clamshell, RLE only, 10x Bridge, 10x, PT block R foot, v cues to limit R knee fall out throughout   03/18/2024 PT evaluation - Education time spent on patient acquiring rolling walker due to significantly unsteady gait and reduced balance deficits.  Patient also educated on increasing activity tolerance and walking more consistently throughout the day.  Education time spent talking about potential orthotic intervention to reduce foot drop and educate on AFO.  PATIENT EDUCATION:  Education details: PT Evaluation, findings, prognosis, frequency, attendance policy, and requiring rolling walker for ambulation and increasing walking time throughout the day.. Person educated: Patient Education method: Medical illustrator Education comprehension: verbalized understanding  HOME EXERCISE PROGRAM: Access Code: [email or web address] URL: [email or web address] Date: 03/28/2024 Prepared by: Rosaria Powell-Butler  Exercises - Seated Long Arc Quad  - 2 x daily - 7 x weekly - 3 sets - 10 reps - Sit to Stand with Arm Reach Toward Target  - 2 x daily - 7 x weekly - 3 sets - 10 reps - Clamshell  - 2 x daily - 7 x weekly - 3 sets - 10 reps - Supine Bridge  - 2 x daily - 7 x weekly - 3 sets - 10 reps  Access Code: [email or web address] D URL: [email or web address] Date: 04/08/2024 Prepared by: Rosaria Powell-Butler  Exercises - Hooklying Single Knee to Chest Stretch with Towel  - 3 x daily - 7 x weekly - 3 sets - 10 reps - Supine Double Knee to Chest  - 3 x daily - 7 x weekly - 3 sets - 10 reps   ASSESSMENT:  CLINICAL IMPRESSION: Patient tolerated session well. Began with gait trial with RW. Patient and wife report they have not looked into [employer] to get RW as of yet but  plan to. While performing LAQ, pt continues to hold LE with hands during exercises. Although patient is exhibiting quad activation, PT instructs patient to try without support of UE and with engaging core. Patient begins to demo increased jerky movements throughout. Patient with moderate to severe clonus when testing quick DF on R. Discussion on previous imaging this date, as in 2022 imaging revealed moderate to severe stenosing on R which could be causing patients muscle weakness and atrophy. Patient reporting MD pushed for surgery but patient not wanting surgery. Remainder of session spent with focus on opening posterior aspect and R side of spinal column.  Stability needed at hip during side-lying clam shells to prevent patient from rolling too far backward. No active DF this date on R, will try e-stim next session. Education given on possible need for AFO.  Patient will benefit from continued skilled physical therapy in order to address R sided weakness, general weakness, endurance, and balance to improve function/QOL.     Patient is a 62y.o. male who was seen today for physical therapy evaluation and treatment for ' right left weakness.'  Patient with history of low back pain, and right sided MCA.  Patient reporting new onset of right lower extremity muscle weakness and muscle atrophy impacting capacity for movement.  Patient shows significant right lower extremity weakness compared to left side with right foot drop noted.  Patient wants to get stronger patient with significant functional mobility deficits impairing endurance, fall risk, transfer capacity, and ADLs due to bilateral lower extremity muscle weakness, right side greater than left, significant balance deficits, reduced postural and balance reactions, reduced ankle range of motion, reduced light touch  sensation. Pt will benefit from skilled Physical Therapy services to address deficits/limitations in order to improve functional and QOL.     OBJECTIVE IMPAIRMENTS: Abnormal gait, decreased activity tolerance, decreased balance, decreased endurance, decreased mobility, difficulty walking, decreased ROM, decreased strength, impaired flexibility, impaired sensation, improper body mechanics, and postural dysfunction.   ACTIVITY LIMITATIONS: carrying, lifting, sitting, standing, squatting, stairs, transfers, bed mobility, and locomotion level  PARTICIPATION LIMITATIONS: driving, shopping, community activity, occupation, yard work, school, and church  PERSONAL FACTORS: Age and 3+ comorbidities: see EMR for details.  are also affecting patient's functional outcome.   REHAB POTENTIAL: Fair hx of stroke and L4-5 Spinal Stenosis  CLINICAL DECISION MAKING: Evolving/moderate complexity  EVALUATION COMPLEXITY: Low   GOALS: Goals reviewed with patient? Yes  SHORT TERM GOALS: Target date: 04/08/24  Pt will be independent with HEP in order to demonstrate participation in Physical Therapy POC.  Baseline: Goal status: INITIAL  2.  Pt will improve LEFS score by 10 %  in order to demonstrate improved pain with functional goals and outcomes. Baseline:  Goal status: INITIAL  LONG TERM GOALS: Target date: 04/29/24  Pt will improve 30 Second Chair Stand Test by at least 2 reps in order to demonstrate improved functional strength to return to desired activities.  Baseline: see objective.  Goal status: INITIAL  2.  Pt will improve TUG by at least 10 seconds in order to demonstrate improved functional ambulatory capacity in community setting.  Baseline: see objective.  Goal status: INITIAL  3.  Pt will improve LEFS score by 20% in order to demonstrate improved pain with functional goals and outcomes. Baseline: see objective.  Goal status: INITIAL  4.  Pt will improve BLE MMT by at least 1/2 grade in order to improve safety during functional activities.. Baseline: see objective.  Goal status: INITIAL  5.  Pt will tolerate at least 3  minutes of ambulation at modified independent level to demonstrate improved functional mobility, endurance, and safety.  Baseline: See objective.  Goal status: Initial   PLAN:  PT FREQUENCY: 2x/week  PT DURATION: 6 weeks  PLANNED INTERVENTIONS: 97164- PT Re-evaluation, 97750- Physical Performance Testing, 97110-Therapeutic exercises, 97530- Therapeutic activity, [id number]- Neuromuscular re-education, 97535- Self Care, [id number]- Manual therapy, [id number]- Gait training, [phone]- Orthotic Initial, [phone]- Prosthetic Initial , [phone]- Orthotic/Prosthetic subsequent, [id number]- Electrical stimulation (unattended), [phone]- Electrical stimulation (manual), [phone] (1-2 muscles), [id number] (3+ muscles)- Dry Needling, Patient/Family education, Balance training, Stair training, Joint mobilization, Joint manipulation, Spinal manipulation, Spinal mobilization, and DME instructions  PLAN FOR NEXT SESSION: Functional mobility, balance, bilateral extremity strengthening, potential e-stim for ankle dorsiflexion on R side., review HEP, discussion on possibility of AFO    6:13 PM, 04/08/24 [name], PT, DPT [facility] [place]

## 2024-04-10 ENCOUNTER — Encounter (HOSPITAL_COMMUNITY)

## 2024-04-15 ENCOUNTER — Ambulatory Visit (HOSPITAL_COMMUNITY): Attending: Family Medicine

## 2024-04-15 ENCOUNTER — Encounter (HOSPITAL_COMMUNITY): Payer: Self-pay

## 2024-04-15 DIAGNOSIS — R29898 Other symptoms and signs involving the musculoskeletal system: Secondary | ICD-10-CM | POA: Insufficient documentation

## 2024-04-15 DIAGNOSIS — Z7409 Other reduced mobility: Secondary | ICD-10-CM | POA: Diagnosis not present

## 2024-04-15 NOTE — Therapy (Signed)
 OUTPATIENT PHYSICAL THERAPY LOWER EXTREMITY TREATMENT   Patient Name: Aaron Medina MRN: [id number] DOB:[birth year], 63 y.o., male Today's Date: 04/15/2024  END OF SESSION:  PT End of Session - 04/15/24 1605     Visit Number 4    Number of Visits 12    Date for PT Re-Evaluation 04/29/24    Authorization Type Medicare a&B    Authorization Time Period No auth, no limit    Progress Note Due on Visit 10    PT Start Time 1605    PT Stop Time 1650    PT Time Calculation (min) 45 min    Activity Tolerance Patient tolerated treatment well    Behavior During Therapy WFL for tasks assessed/performed          Past Medical History:  Diagnosis Date   Acute coronary syndrome (HCC)    Occurred in the 1980s in the setting of cocaine use   CAD (coronary artery disease)    Moderate proximal LAD disease associated with dissection and thrombus managed medically at [facility] in 1991   Hyperlipidemia    Hypertension    Lumbar spondylosis    Paroxysmal atrial fibrillation (HCC)    Stroke (HCC) 10/10/2013   Type 2 diabetes mellitus (HCC)    Past Surgical History:  Procedure Laterality Date   LYMPH NODE BIOPSY     Patient Active Problem List   Diagnosis Date Noted   Cerebrovascular accident (CVA) (HCC) 08/19/2021   Gait abnormality 08/19/2021   Right leg weakness 02/25/2019   Chronic midline low back pain with right-sided sciatica 02/25/2019   Spondylolisthesis 11/12/2014   Type 2 diabetes mellitus without complication (HCC) 07/29/2014   Embolic stroke involving right middle cerebral artery (HCC) 07/29/2014   PAF (paroxysmal atrial fibrillation) (HCC) 01/13/2014   History of CVA (cerebrovascular accident) 01/10/2014   OA (osteoarthritis) 06/23/2013   Thrombocytosis 03/21/2013   Essential hypertension, benign 03/18/2013   Hyperlipidemia 03/18/2013   Tobacco use 03/18/2013   Shoulder pain, bilateral 03/18/2013    PCP: Shona Norleen PEDLAR, MD  REFERRING PROVIDER: Hyacinth Honey,  NP  REFERRING DIAG: Right leg weakness  THERAPY DIAG:  Right leg weakness  Impaired functional mobility, balance, gait, and endurance  Rationale for Evaluation and Treatment: Rehabilitation  ONSET DATE: long time  SUBJECTIVE:   SUBJECTIVE STATEMENT: Pt reports he feels okay today. Reports he didn't do much of his HEP over the weekend but did do a lot around the house and a lot of walking. No pain today.    Recent onset within 2-3 years, pt's RLE weakness has gotten worse. Prolonged Lumbar back issues that MD recommended surgery years ago but pt did not go through with it. Pt's primary complaint in RLE weakness and R hip pain. Major difficulty with stair negotiation.   PERTINENT HISTORY: Stroke/CVA about 10 years ago Lumbar disc L4-L5 protrusion  Diabetic PAIN:  Are you having pain? No general aches and pains  PRECAUTIONS: None  RED FLAGS: None   WEIGHT BEARING RESTRICTIONS: No  FALLS:  Has patient fallen in last 6 months? No  PATIENT GOALS: I want to gain more manuveurability   OBJECTIVE:  Note: Objective measures were completed at Evaluation unless otherwise noted.  DIAGNOSTIC FINDINGS:  MRI of lumbar from [facility] on April 21, 2021, mild L4-5 spinal stenosis, small to moderate left median directly synovial cyst contents upon left lateral thecal sac, with mild ventral displacement of the descending L4 nerve roots, moderate severe bilateral neuroforaminal narrowing impinging upon and displacing bilateral  L4 nerve roots. Moderate to severe right and mild to moderate left L5-S1 neuroforaminal narrowing, impinging upon the right lateral V nerve roots, without spinal stenosis, L3-4, no significant canal or foraminal stenosis multilevel degenerative changes   PATIENT SURVEYS:  LEFS: 16/80 = 20%  COGNITION: Overall cognitive status: Within functional limits for tasks assessed     SENSATION: Light touch: Impaired  and Decreased light touch in dermatomal pattern  L3-4 Proprioception: WFL  POSTURE: decreased lumbar lordosis and flexed trunk   PALPATION:   LOWER EXTREMITY ROM:  Active ROM Right eval Left eval  Hip flexion    Hip extension    Hip abduction    Hip adduction    Hip internal rotation    Hip external rotation    Knee flexion    Knee extension    Ankle dorsiflexion    Ankle plantarflexion    Ankle inversion    Ankle eversion     (Blank rows = not tested)  LOWER EXTREMITY MMT:  MMT Right eval Left eval  Hip flexion 2+ 3+  Hip extension    Hip abduction 3 4-  Hip adduction    Hip internal rotation    Hip external rotation    Knee flexion 3- 4-  Knee extension 3+ 4+  Ankle dorsiflexion 0 4-  Ankle plantarflexion    Ankle inversion    Ankle eversion     (Blank rows = not tested)   FUNCTIONAL TESTS:  Timed up and go (TUG): 63 seconds w/ RW  30 Second Chair Stand Test: 5x  Norms:   Age 56-64 73-69 70-74 75-79 80-84 85-89 90-94  Women 15 15 14 13 12 11 9   Men 17 16 15 14 13 11 9    L SLS: 4.49 seconds  R SLS: 1.38 seconds  Norms: 18-39  F: 43.5 seconds  M: 43.2 seconds 40-49  F: 40.4 seconds  M: 40.1 seconds 50-59  F: 36 seconds  M: 38.1 seconds 60-69  F: 25.1 seconds  M: 28.7 seconds 70-79  F: 11.3 seconds  M: 18.3 seconds  GAIT: Distance walked: 36ft Assistive device utilized: bilateral single point cane Level of assistance: SBA Comments: Bilateral single-point cane ambulation with step to pattern increased forward trunk flexion.                                                                                                                                TREATMENT DATE:  04/15/24: NuStep, 5', level 1, v cues to dec knee fall out E-stim: [place], 20% duty cycle, 76 volts CV, 10/10 cycle time, 100 bps, 2 sec ramp time, 10'  -Instruction for active DF during on time Gait training with RW, 75 ft, w/ emphasis on DF and knee flexion throughout    04/08/24:  Gait training: 76 ft x2 w/  RW Seated LAQ, 2x10 Positive DF clonus, mod-severe SKTC, R and L, 35 x3 each side DKTC: 3x35 Left side-lying over  foam roll, + clamshell, 10x, PT providing stabilization at hip  03/28/24 Review of goals Gait training: 38 ft w/ RW, 76 ft w/ RW Seated LAQ, 2x10, pt w/ tendency to hold onto R quad during activity, instructed to allow muscles to do all the work STS, w RW support, 10x, pt demo hyperext of R knee, verbal and tactile cueing blocking at knees to prevent Clamshell, RLE only, 10x Bridge, 10x, PT block R foot, v cues to limit R knee fall out throughout    PATIENT EDUCATION:  Education details: PT Evaluation, findings, prognosis, frequency, attendance policy, and requiring rolling walker for ambulation and increasing walking time throughout the day.. Person educated: Patient Education method: Medical illustrator Education comprehension: verbalized understanding  HOME EXERCISE PROGRAM: Access Code: [email or web address] URL: [email or web address] Date: 03/28/2024 Prepared by: Rosaria Powell-Butler  Exercises - Seated Long Arc Quad  - 2 x daily - 7 x weekly - 3 sets - 10 reps - Sit to Stand with Arm Reach Toward Target  - 2 x daily - 7 x weekly - 3 sets - 10 reps - Clamshell  - 2 x daily - 7 x weekly - 3 sets - 10 reps - Supine Bridge  - 2 x daily - 7 x weekly - 3 sets - 10 reps  Access Code: [email or web address] D URL: [email or web address] Date: 04/08/2024 Prepared by: Rosaria Powell-Butler  Exercises - Hooklying Single Knee to Chest Stretch with Towel  - 3 x daily - 7 x weekly - 3 sets - 10 reps - Supine Double Knee to Chest  - 3 x daily - 7 x weekly - 3 sets - 10 reps   ASSESSMENT:  CLINICAL IMPRESSION: Patient tolerated session well. Began on NuStep for general warm-up and increased input into RLE. Verbal cueing to decrease knee fall out. Followed with [place] E-stim for R anterior tibialis musculature. Able to achieve DF at 76 volts. Patient instructed  to actively try to DF R ankle during ON section of cycle. Ended with gait training with use of RW. Patient with little active DF afterwards and continues to demonstrate hyperextension in R knee during stance as well as circumduction. Patient may benefit from [place] e-stim to R hamstring musculature as well. Patient and wife reporting they tried to go by [employer] to look into RW but they were closed that day. Will follow up.  Patient will benefit from continued skilled physical therapy in order to address R sided weakness, general weakness, endurance, and balance to improve function/QOL.     Patient is a 62y.o. male who was seen today for physical therapy evaluation and treatment for ' right left weakness.'  Patient with history of low back pain, and right sided MCA.  Patient reporting new onset of right lower extremity muscle weakness and muscle atrophy impacting capacity for movement.  Patient shows significant right lower extremity weakness compared to left side with right foot drop noted.  Patient wants to get stronger patient with significant functional mobility deficits impairing endurance, fall risk, transfer capacity, and ADLs due to bilateral lower extremity muscle weakness, right side greater than left, significant balance deficits, reduced postural and balance reactions, reduced ankle range of motion, reduced light touch sensation. Pt will benefit from skilled Physical Therapy services to address deficits/limitations in order to improve functional and QOL.    OBJECTIVE IMPAIRMENTS: Abnormal gait, decreased activity tolerance, decreased balance, decreased endurance, decreased mobility, difficulty walking, decreased ROM, decreased strength, impaired flexibility, impaired sensation, improper body mechanics, and postural dysfunction.  ACTIVITY LIMITATIONS: carrying, lifting, sitting, standing, squatting, stairs, transfers, bed mobility, and locomotion level  PARTICIPATION LIMITATIONS: driving,  shopping, community activity, occupation, yard work, school, and church  PERSONAL FACTORS: Age and 3+ comorbidities: see EMR for details.  are also affecting patient's functional outcome.   REHAB POTENTIAL: Fair hx of stroke and L4-5 Spinal Stenosis  CLINICAL DECISION MAKING: Evolving/moderate complexity  EVALUATION COMPLEXITY: Low   GOALS: Goals reviewed with patient? Yes  SHORT TERM GOALS: Target date: 04/08/24  Pt will be independent with HEP in order to demonstrate participation in Physical Therapy POC.  Baseline: Goal status: INITIAL  2.  Pt will improve LEFS score by 10 %  in order to demonstrate improved pain with functional goals and outcomes. Baseline:  Goal status: INITIAL  LONG TERM GOALS: Target date: 04/29/24  Pt will improve 30 Second Chair Stand Test by at least 2 reps in order to demonstrate improved functional strength to return to desired activities.  Baseline: see objective.  Goal status: INITIAL  2.  Pt will improve TUG by at least 10 seconds in order to demonstrate improved functional ambulatory capacity in community setting.  Baseline: see objective.  Goal status: INITIAL  3.  Pt will improve LEFS score by 20% in order to demonstrate improved pain with functional goals and outcomes. Baseline: see objective.  Goal status: INITIAL  4.  Pt will improve BLE MMT by at least 1/2 grade in order to improve safety during functional activities.. Baseline: see objective.  Goal status: INITIAL  5.  Pt will tolerate at least 3 minutes of ambulation at modified independent level to demonstrate improved functional mobility, endurance, and safety.  Baseline: See objective.  Goal status: Initial   PLAN:  PT FREQUENCY: 2x/week  PT DURATION: 6 weeks  PLANNED INTERVENTIONS: 97164- PT Re-evaluation, 97750- Physical Performance Testing, 97110-Therapeutic exercises, 97530- Therapeutic activity, [id number]- Neuromuscular re-education, 97535- Self Care, [id number]- Manual  therapy, [id number]- Gait training, [phone]- Orthotic Initial, [phone]- Prosthetic Initial , [phone]- Orthotic/Prosthetic subsequent, [id number]- Electrical stimulation (unattended), [phone]- Electrical stimulation (manual), [phone] (1-2 muscles), [id number] (3+ muscles)- Dry Needling, Patient/Family education, Balance training, Stair training, Joint mobilization, Joint manipulation, Spinal manipulation, Spinal mobilization, and DME instructions  PLAN FOR NEXT SESSION: Functional mobility, balance, bilateral extremity strengthening, e-stim for ankle dorsiflexion on R side, possible e-stim for R hams, discussion on possibility of AFO  5:20 PM, 04/15/24 Nada Godley Powell-Butler, PT, DPT [facility] [place]

## 2024-04-18 ENCOUNTER — Encounter (HOSPITAL_COMMUNITY)

## 2024-04-23 ENCOUNTER — Ambulatory Visit (HOSPITAL_COMMUNITY)

## 2024-04-23 ENCOUNTER — Encounter (HOSPITAL_COMMUNITY): Payer: Self-pay

## 2024-04-23 DIAGNOSIS — Z7409 Other reduced mobility: Secondary | ICD-10-CM

## 2024-04-23 DIAGNOSIS — R29898 Other symptoms and signs involving the musculoskeletal system: Secondary | ICD-10-CM | POA: Diagnosis not present

## 2024-04-23 NOTE — Therapy (Signed)
 OUTPATIENT PHYSICAL THERAPY LOWER EXTREMITY TREATMENT   Patient Name: Aaron Medina MRN: [id number] DOB:14-Aug-1961, 63 y.o., male Today's Date: 04/23/2024  END OF SESSION:  PT End of Session - 04/23/24 1601     Visit Number 5    Number of Visits 12    Date for PT Re-Evaluation 04/29/24    Authorization Type Medicare a&B    Authorization Time Period No auth, no limit    Progress Note Due on Visit 10    PT Start Time 1601    PT Stop Time 1644    PT Time Calculation (min) 43 min    Activity Tolerance Patient tolerated treatment well    Behavior During Therapy WFL for tasks assessed/performed           Past Medical History:  Diagnosis Date   Acute coronary syndrome (HCC)    Occurred in the 1980s in the setting of cocaine use   CAD (coronary artery disease)    Moderate proximal LAD disease associated with dissection and thrombus managed medically at [facility] in 1991   Hyperlipidemia    Hypertension    Lumbar spondylosis    Paroxysmal atrial fibrillation (HCC)    Stroke (HCC) 10/10/2013   Type 2 diabetes mellitus (HCC)    Past Surgical History:  Procedure Laterality Date   LYMPH NODE BIOPSY     Patient Active Problem List   Diagnosis Date Noted   Cerebrovascular accident (CVA) (HCC) 08/19/2021   Gait abnormality 08/19/2021   Right leg weakness 02/25/2019   Chronic midline low back pain with right-sided sciatica 02/25/2019   Spondylolisthesis 11/12/2014   Type 2 diabetes mellitus without complication (HCC) 07/29/2014   Embolic stroke involving right middle cerebral artery (HCC) 07/29/2014   PAF (paroxysmal atrial fibrillation) (HCC) 01/13/2014   History of CVA (cerebrovascular accident) 01/10/2014   OA (osteoarthritis) 06/23/2013   Thrombocytosis 03/21/2013   Essential hypertension, benign 03/18/2013   Hyperlipidemia 03/18/2013   Tobacco use 03/18/2013   Shoulder pain, bilateral 03/18/2013    PCP: Shona Norleen PEDLAR, MD  REFERRING PROVIDER: Hyacinth Honey,  NP  REFERRING DIAG: Right leg weakness  THERAPY DIAG:  Right leg weakness  Impaired functional mobility, balance, gait, and endurance  Rationale for Evaluation and Treatment: Rehabilitation  ONSET DATE: long time  SUBJECTIVE:   SUBJECTIVE STATEMENT: Pt states he had a little slip up in the shower but was able to catch shower seat before falling all the way to the floor, states he is a little sore. Pt states he is still struggling with hip abduction laying on his side. Pt states honestly he has been doing about HEP every other day. Pt states he is dealing with a stye on the right eye.    Recent onset within 2-3 years, pt's RLE weakness has gotten worse. Prolonged Lumbar back issues that MD recommended surgery years ago but pt did not go through with it. Pt's primary complaint in RLE weakness and R hip pain. Major difficulty with stair negotiation.   PERTINENT HISTORY: Stroke/CVA about 10 years ago Lumbar disc L4-L5 protrusion  Diabetic PAIN:  Are you having pain? No general aches and pains  PRECAUTIONS: None  RED FLAGS: None   WEIGHT BEARING RESTRICTIONS: No  FALLS:  Has patient fallen in last 6 months? No  PATIENT GOALS: I want to gain more manuveurability   OBJECTIVE:  Note: Objective measures were completed at Evaluation unless otherwise noted.  DIAGNOSTIC FINDINGS:  MRI of lumbar from [facility] on April 21, 2021, mild  L4-5 spinal stenosis, small to moderate left median directly synovial cyst contents upon left lateral thecal sac, with mild ventral displacement of the descending L4 nerve roots, moderate severe bilateral neuroforaminal narrowing impinging upon and displacing bilateral L4 nerve roots. Moderate to severe right and mild to moderate left L5-S1 neuroforaminal narrowing, impinging upon the right lateral V nerve roots, without spinal stenosis, L3-4, no significant canal or foraminal stenosis multilevel degenerative changes   PATIENT SURVEYS:  LEFS:  16/80 = 20%  COGNITION: Overall cognitive status: Within functional limits for tasks assessed     SENSATION: Light touch: Impaired  and Decreased light touch in dermatomal pattern L3-4 Proprioception: WFL  POSTURE: decreased lumbar lordosis and flexed trunk   PALPATION:   LOWER EXTREMITY ROM:  Active ROM Right eval Left eval  Hip flexion    Hip extension    Hip abduction    Hip adduction    Hip internal rotation    Hip external rotation    Knee flexion    Knee extension    Ankle dorsiflexion    Ankle plantarflexion    Ankle inversion    Ankle eversion     (Blank rows = not tested)  LOWER EXTREMITY MMT:  MMT Right eval Left eval  Hip flexion 2+ 3+  Hip extension    Hip abduction 3 4-  Hip adduction    Hip internal rotation    Hip external rotation    Knee flexion 3- 4-  Knee extension 3+ 4+  Ankle dorsiflexion 0 4-  Ankle plantarflexion    Ankle inversion    Ankle eversion     (Blank rows = not tested)   FUNCTIONAL TESTS:  Timed up and go (TUG): 63 seconds w/ RW  30 Second Chair Stand Test: 5x  Norms:   Age 56-64 63-69 70-74 75-79 5-84 85-89 90-94  Women 15 15 14 13 12 11 9   Men 17 16 15 14 13 11 9    L SLS: 4.49 seconds  R SLS: 1.38 seconds  Norms: 18-39  F: 43.5 seconds  M: 43.2 seconds 40-49  F: 40.4 seconds  M: 40.1 seconds 50-59  F: 36 seconds  M: 38.1 seconds 60-69  F: 25.1 seconds  M: 28.7 seconds 70-79  F: 11.3 seconds  M: 18.3 seconds  GAIT: Distance walked: 61ft Assistive device utilized: bilateral single point cane Level of assistance: SBA Comments: Bilateral single-point cane ambulation with step to pattern increased forward trunk flexion.                                                                                                                                TREATMENT DATE:  04/23/2024  Therapeutic Exercise: -Nustep, 5 minutes, level 4 resistance, pt cued for 100 spm -Leg Press, 3 sets of 10 reps, plate 5,  plate 6, pt cued for avoidance of locking out knees, decreased knee valgus and eccentric control -Sit to stands, 2 sets of 4  reps, throughout session  Neuromuscular Re-education: -Standing heel raises, 2 sets of 15 reps -Bosu ball lunges, BUE support, 1 set of 7 reps bilaterally -Rocker board, DF/PF, EV/IV, 2 sets of 10 reps bilaterally -Cone touches, 2 sets of 10 reps bilaterally   04/15/24: NuStep, 5', level 1, v cues to dec knee fall out E-stim: [place], 20% duty cycle, 76 volts CV, 10/10 cycle time, 100 bps, 2 sec ramp time, 10'  -Instruction for active DF during on time Gait training with RW, 75 ft, w/ emphasis on DF and knee flexion throughout    04/08/24:  Gait training: 76 ft x2 w/ RW Seated LAQ, 2x10 Positive DF clonus, mod-severe SKTC, R and L, 35 x3 each side DKTC: 3x35 Left side-lying over foam roll, + clamshell, 10x, PT providing stabilization at hip  03/28/24 Review of goals Gait training: 38 ft w/ RW, 76 ft w/ RW Seated LAQ, 2x10, pt w/ tendency to hold onto R quad during activity, instructed to allow muscles to do all the work STS, w RW support, 10x, pt demo hyperext of R knee, verbal and tactile cueing blocking at knees to prevent Clamshell, RLE only, 10x Bridge, 10x, PT block R foot, v cues to limit R knee fall out throughout    PATIENT EDUCATION:  Education details: PT Evaluation, findings, prognosis, frequency, attendance policy, and requiring rolling walker for ambulation and increasing walking time throughout the day.. Person educated: Patient Education method: Medical illustrator Education comprehension: verbalized understanding  HOME EXERCISE PROGRAM: Access Code: [email or web address] URL: [email or web address] Date: 03/28/2024 Prepared by: Rosaria Powell-Butler  Exercises - Seated Long Arc Quad  - 2 x daily - 7 x weekly - 3 sets - 10 reps - Sit to Stand with Arm Reach Toward Target  - 2 x daily - 7 x weekly - 3 sets - 10 reps -  Clamshell  - 2 x daily - 7 x weekly - 3 sets - 10 reps - Supine Bridge  - 2 x daily - 7 x weekly - 3 sets - 10 reps  Access Code: [email or web address] D URL: [email or web address] Date: 04/08/2024 Prepared by: Rosaria Powell-Butler  Exercises - Hooklying Single Knee to Chest Stretch with Towel  - 3 x daily - 7 x weekly - 3 sets - 10 reps - Supine Double Knee to Chest  - 3 x daily - 7 x weekly - 3 sets - 10 reps   ASSESSMENT:  CLINICAL IMPRESSION: Patient continues to demonstrate decreased RLE strength, decreased gait quality and balance. Patient also demonstrates decreased coordination with RLE exercise during today's session. Pt would benefit from trial in RLE AFO due to decreased activation observed in R ankle dorsiflexors. Patient able to progress dynamic balance and core activation exercises today with cone touches with feet and leg press, good performance with verbal cueing. Patient would continue to benefit from skilled physical therapy for increased endurance with ambulation, increased RLE strength, and improved balance for improved quality of life, improved independence with gait training and continued progress towards therapy goals.     Patient is a 62y.o. male who was seen today for physical therapy evaluation and treatment for ' right left weakness.'  Patient with history of low back pain, and right sided MCA.  Patient reporting new onset of right lower extremity muscle weakness and muscle atrophy impacting capacity for movement.  Patient shows significant right lower extremity weakness compared to left side with right foot drop noted.  Patient wants to get stronger patient with significant functional mobility deficits  impairing endurance, fall risk, transfer capacity, and ADLs due to bilateral lower extremity muscle weakness, right side greater than left, significant balance deficits, reduced postural and balance reactions, reduced ankle range of motion, reduced light touch sensation.  Pt will benefit from skilled Physical Therapy services to address deficits/limitations in order to improve functional and QOL.    OBJECTIVE IMPAIRMENTS: Abnormal gait, decreased activity tolerance, decreased balance, decreased endurance, decreased mobility, difficulty walking, decreased ROM, decreased strength, impaired flexibility, impaired sensation, improper body mechanics, and postural dysfunction.   ACTIVITY LIMITATIONS: carrying, lifting, sitting, standing, squatting, stairs, transfers, bed mobility, and locomotion level  PARTICIPATION LIMITATIONS: driving, shopping, community activity, occupation, yard work, school, and church  PERSONAL FACTORS: Age and 3+ comorbidities: see EMR for details.  are also affecting patient's functional outcome.   REHAB POTENTIAL: Fair hx of stroke and L4-5 Spinal Stenosis  CLINICAL DECISION MAKING: Evolving/moderate complexity  EVALUATION COMPLEXITY: Low   GOALS: Goals reviewed with patient? Yes  SHORT TERM GOALS: Target date: 04/08/24  Pt will be independent with HEP in order to demonstrate participation in Physical Therapy POC.  Baseline: Goal status: INITIAL  2.  Pt will improve LEFS score by 10 %  in order to demonstrate improved pain with functional goals and outcomes. Baseline:  Goal status: INITIAL  LONG TERM GOALS: Target date: 04/29/24  Pt will improve 30 Second Chair Stand Test by at least 2 reps in order to demonstrate improved functional strength to return to desired activities.  Baseline: see objective.  Goal status: INITIAL  2.  Pt will improve TUG by at least 10 seconds in order to demonstrate improved functional ambulatory capacity in community setting.  Baseline: see objective.  Goal status: INITIAL  3.  Pt will improve LEFS score by 20% in order to demonstrate improved pain with functional goals and outcomes. Baseline: see objective.  Goal status: INITIAL  4.  Pt will improve BLE MMT by at least 1/2 grade in order to  improve safety during functional activities.. Baseline: see objective.  Goal status: INITIAL  5.  Pt will tolerate at least 3 minutes of ambulation at modified independent level to demonstrate improved functional mobility, endurance, and safety.  Baseline: See objective.  Goal status: Initial   PLAN:  PT FREQUENCY: 2x/week  PT DURATION: 6 weeks  PLANNED INTERVENTIONS: 97164- PT Re-evaluation, 97750- Physical Performance Testing, 97110-Therapeutic exercises, 97530- Therapeutic activity, [id number]- Neuromuscular re-education, 97535- Self Care, [id number]- Manual therapy, [phone]- Gait training, [phone]- Orthotic Initial, [phone]- Prosthetic Initial , [phone]- Orthotic/Prosthetic subsequent, [id number]- Electrical stimulation (unattended), [phone]- Electrical stimulation (manual), [phone] (1-2 muscles), [id number] (3+ muscles)- Dry Needling, Patient/Family education, Balance training, Stair training, Joint mobilization, Joint manipulation, Spinal manipulation, Spinal mobilization, and DME instructions  PLAN FOR NEXT SESSION: Functional mobility, balance, bilateral extremity strengthening, e-stim for ankle dorsiflexion on R side, possible e-stim for R hams, discussion on possibility of AFO  Lang Ada, PT, DPT [facility] Office: [phone] 4:50 PM, 04/23/24

## 2024-04-25 ENCOUNTER — Ambulatory Visit (HOSPITAL_COMMUNITY)

## 2024-04-25 DIAGNOSIS — Z7409 Other reduced mobility: Secondary | ICD-10-CM

## 2024-04-25 DIAGNOSIS — R29898 Other symptoms and signs involving the musculoskeletal system: Secondary | ICD-10-CM

## 2024-04-25 NOTE — Therapy (Addendum)
 OUTPATIENT PHYSICAL THERAPY LOWER EXTREMITY TREATMENT   Patient Name: Aaron Medina MRN: [id number] DOB:12/14/1960, 63 y.o., male Today's Date: 04/25/2024  END OF SESSION:  PT End of Session - 04/25/24 1602     Visit Number 6    Number of Visits 12    Date for PT Re-Evaluation 04/29/24    Authorization Type Medicare a&B    Authorization Time Period No auth, no limit    Progress Note Due on Visit 10    PT Start Time 1605    PT Stop Time 1649    PT Time Calculation (min) 44 min    Activity Tolerance Patient tolerated treatment well    Behavior During Therapy WFL for tasks assessed/performed           Past Medical History:  Diagnosis Date   Acute coronary syndrome (HCC)    Occurred in the 1980s in the setting of cocaine use   CAD (coronary artery disease)    Moderate proximal LAD disease associated with dissection and thrombus managed medically at [facility] in 1991   Hyperlipidemia    Hypertension    Lumbar spondylosis    Paroxysmal atrial fibrillation (HCC)    Stroke (HCC) 10/10/2013   Type 2 diabetes mellitus (HCC)    Past Surgical History:  Procedure Laterality Date   LYMPH NODE BIOPSY     Patient Active Problem List   Diagnosis Date Noted   Cerebrovascular accident (CVA) (HCC) 08/19/2021   Gait abnormality 08/19/2021   Right leg weakness 02/25/2019   Chronic midline low back pain with right-sided sciatica 02/25/2019   Spondylolisthesis 11/12/2014   Type 2 diabetes mellitus without complication (HCC) 07/29/2014   Embolic stroke involving right middle cerebral artery (HCC) 07/29/2014   PAF (paroxysmal atrial fibrillation) (HCC) 01/13/2014   History of CVA (cerebrovascular accident) 01/10/2014   OA (osteoarthritis) 06/23/2013   Thrombocytosis 03/21/2013   Essential hypertension, benign 03/18/2013   Hyperlipidemia 03/18/2013   Tobacco use 03/18/2013   Shoulder pain, bilateral 03/18/2013    PCP: Shona Norleen PEDLAR, MD  REFERRING PROVIDER: Hyacinth Honey,  NP  REFERRING DIAG: Right leg weakness  THERAPY DIAG:  Right leg weakness  Impaired functional mobility, balance, gait, and endurance  Rationale for Evaluation and Treatment: Rehabilitation  ONSET DATE: long time  SUBJECTIVE:   SUBJECTIVE STATEMENT: Patient with no new reports of fall/near fall. Reports he had a good workout last session, would prefer to continue just general strengthening.   Recent onset within 2-3 years, pt's RLE weakness has gotten worse. Prolonged Lumbar back issues that MD recommended surgery years ago but pt did not go through with it. Pt's primary complaint in RLE weakness and R hip pain. Major difficulty with stair negotiation.   PERTINENT HISTORY: Stroke/CVA about 10 years ago Lumbar disc L4-L5 protrusion  Diabetic PAIN:  Are you having pain? No general aches and pains  PRECAUTIONS: None  RED FLAGS: None   WEIGHT BEARING RESTRICTIONS: No  FALLS:  Has patient fallen in last 6 months? No  PATIENT GOALS: I want to gain more manuveurability   OBJECTIVE:  Note: Objective measures were completed at Evaluation unless otherwise noted.  DIAGNOSTIC FINDINGS:  MRI of lumbar from [facility] on April 21, 2021, mild L4-5 spinal stenosis, small to moderate left median directly synovial cyst contents upon left lateral thecal sac, with mild ventral displacement of the descending L4 nerve roots, moderate severe bilateral neuroforaminal narrowing impinging upon and displacing bilateral L4 nerve roots. Moderate to severe right and mild to  moderate left L5-S1 neuroforaminal narrowing, impinging upon the right lateral V nerve roots, without spinal stenosis, L3-4, no significant canal or foraminal stenosis multilevel degenerative changes   PATIENT SURVEYS:  LEFS: 16/80 = 20%  COGNITION: Overall cognitive status: Within functional limits for tasks assessed     SENSATION: Light touch: Impaired  and Decreased light touch in dermatomal pattern  L3-4 Proprioception: WFL  POSTURE: decreased lumbar lordosis and flexed trunk   PALPATION:   LOWER EXTREMITY ROM:  Active ROM Right eval Left eval  Hip flexion    Hip extension    Hip abduction    Hip adduction    Hip internal rotation    Hip external rotation    Knee flexion    Knee extension    Ankle dorsiflexion    Ankle plantarflexion    Ankle inversion    Ankle eversion     (Blank rows = not tested)  LOWER EXTREMITY MMT:  MMT Right eval Left eval  Hip flexion 2+ 3+  Hip extension    Hip abduction 3 4-  Hip adduction    Hip internal rotation    Hip external rotation    Knee flexion 3- 4-  Knee extension 3+ 4+  Ankle dorsiflexion 0 4-  Ankle plantarflexion    Ankle inversion    Ankle eversion     (Blank rows = not tested)   FUNCTIONAL TESTS:  Timed up and go (TUG): 63 seconds w/ RW  30 Second Chair Stand Test: 5x  Norms:   Age 40-64 36-69 70-74 75-79 80-84 85-89 90-94  Women 15 15 14 13 12 11 9   Men 17 16 15 14 13 11 9    L SLS: 4.49 seconds  R SLS: 1.38 seconds  Norms: 18-39  F: 43.5 seconds  M: 43.2 seconds 40-49  F: 40.4 seconds  M: 40.1 seconds 50-59  F: 36 seconds  M: 38.1 seconds 60-69  F: 25.1 seconds  M: 28.7 seconds 70-79  F: 11.3 seconds  M: 18.3 seconds  GAIT: Distance walked: 51ft Assistive device utilized: bilateral single point cane Level of assistance: SBA Comments: Bilateral single-point cane ambulation with step to pattern increased forward trunk flexion.                                                                                                                                TREATMENT DATE:  04/25/24: Nustep, 5 minutes, level 5 resistance, pt cued for 100 spm Sit to stands, feet on foam, BUE --> one UE support, 10x Hip Vectors, 10x each side, 2 lb. AW, inc lean when performing with RLE.  Heel raises, 3x10 Standing R Hip Isometrics against wall, flex/abd/ext, 10 holds, 5-10x, support of chair for  balance Side Steps, in // bars, 3x down and back, 2 lb AW   04/23/2024  Therapeutic Exercise: -Nustep, 5 minutes, level 4 resistance, pt cued for 100 spm -Leg Press, 3 sets of 10 reps, plate  5, plate 6, pt cued for avoidance of locking out knees, decreased knee valgus and eccentric control -Sit to stands, 2 sets of 4 reps, throughout session  Neuromuscular Re-education: -Standing heel raises, 2 sets of 15 reps -Bosu ball lunges, BUE support, 1 set of 7 reps bilaterally -Rocker board, DF/PF, EV/IV, 2 sets of 10 reps bilaterally -Cone touches, 2 sets of 10 reps bilaterally   04/15/24: NuStep, 5', level 1, v cues to dec knee fall out E-stim: [place], 20% duty cycle, 76 volts CV, 10/10 cycle time, 100 bps, 2 sec ramp time, 10'  -Instruction for active DF during on time Gait training with RW, 75 ft, w/ emphasis on DF and knee flexion throughout    PATIENT EDUCATION:  Education details: PT Evaluation, findings, prognosis, frequency, attendance policy, and requiring rolling walker for ambulation and increasing walking time throughout the day.. Person educated: Patient Education method: Medical illustrator Education comprehension: verbalized understanding  HOME EXERCISE PROGRAM: Access Code: [email or web address] URL: [email or web address] Date: 03/28/2024 Prepared by: Rosaria Powell-Butler  Exercises - Seated Long Arc Quad  - 2 x daily - 7 x weekly - 3 sets - 10 reps - Sit to Stand with Arm Reach Toward Target  - 2 x daily - 7 x weekly - 3 sets - 10 reps - Clamshell  - 2 x daily - 7 x weekly - 3 sets - 10 reps - Supine Bridge  - 2 x daily - 7 x weekly - 3 sets - 10 reps  Access Code: [email or web address] D URL: [email or web address] Date: 04/08/2024 Prepared by: Rosaria Powell-Butler  Exercises - Hooklying Single Knee to Chest Stretch with Towel  - 3 x daily - 7 x weekly - 3 sets - 10 reps - Supine Double Knee to Chest  - 3 x daily - 7 x weekly - 3 sets - 10  reps   ASSESSMENT:  CLINICAL IMPRESSION: Patient given source of possible referrals of Orthotists for AFO for RLE as he continues to demonstrate difficulty with active motions (especially hip flexion and ankle DF) against gravity in all positions which place him at increased fall risk. Patient told to contact primary or referring MD to start process.  Began on NuStep for general strengthening with inc resistance. Followed with LE strengthening activities. Attempts at hip flexion/marching in parallel bars unsuccessful with RLE. Regressed to standing isometrics against wall. Pt demonstrating continued difficulty and pulsing/jerky movements when engaging R hip musculature. Patient reporting appropriate fatigue at EOS. Patient would continue to benefit from skilled physical therapy for increased endurance with ambulation, increased RLE strength, and improved balance for improved quality of life, improved independence with gait training and continued progress towards therapy goals.    Patient is a 62y.o. male who was seen today for physical therapy evaluation and treatment for ' right left weakness.'  Patient with history of low back pain, and right sided MCA.  Patient reporting new onset of right lower extremity muscle weakness and muscle atrophy impacting capacity for movement.  Patient shows significant right lower extremity weakness compared to left side with right foot drop noted.  Patient wants to get stronger patient with significant functional mobility deficits impairing endurance, fall risk, transfer capacity, and ADLs due to bilateral lower extremity muscle weakness, right side greater than left, significant balance deficits, reduced postural and balance reactions, reduced ankle range of motion, reduced light touch sensation. Pt will benefit from skilled Physical Therapy services to address deficits/limitations in order to improve functional and QOL.    OBJECTIVE IMPAIRMENTS:  Abnormal gait,  decreased activity tolerance, decreased balance, decreased endurance, decreased mobility, difficulty walking, decreased ROM, decreased strength, impaired flexibility, impaired sensation, improper body mechanics, and postural dysfunction.   ACTIVITY LIMITATIONS: carrying, lifting, sitting, standing, squatting, stairs, transfers, bed mobility, and locomotion level  PARTICIPATION LIMITATIONS: driving, shopping, community activity, occupation, yard work, school, and church  PERSONAL FACTORS: Age and 3+ comorbidities: see EMR for details.  are also affecting patient's functional outcome.   REHAB POTENTIAL: Fair hx of stroke and L4-5 Spinal Stenosis  CLINICAL DECISION MAKING: Evolving/moderate complexity  EVALUATION COMPLEXITY: Low   GOALS: Goals reviewed with patient? Yes  SHORT TERM GOALS: Target date: 04/08/24  Pt will be independent with HEP in order to demonstrate participation in Physical Therapy POC.  Baseline: Goal status: INITIAL  2.  Pt will improve LEFS score by 10 %  in order to demonstrate improved pain with functional goals and outcomes. Baseline:  Goal status: INITIAL  LONG TERM GOALS: Target date: 04/29/24  Pt will improve 30 Second Chair Stand Test by at least 2 reps in order to demonstrate improved functional strength to return to desired activities.  Baseline: see objective.  Goal status: INITIAL  2.  Pt will improve TUG by at least 10 seconds in order to demonstrate improved functional ambulatory capacity in community setting.  Baseline: see objective.  Goal status: INITIAL  3.  Pt will improve LEFS score by 20% in order to demonstrate improved pain with functional goals and outcomes. Baseline: see objective.  Goal status: INITIAL  4.  Pt will improve BLE MMT by at least 1/2 grade in order to improve safety during functional activities.. Baseline: see objective.  Goal status: INITIAL  5.  Pt will tolerate at least 3 minutes of ambulation at modified  independent level to demonstrate improved functional mobility, endurance, and safety.  Baseline: See objective.  Goal status: Initial   PLAN:  PT FREQUENCY: 2x/week  PT DURATION: 6 weeks  PLANNED INTERVENTIONS: [id number]- PT Re-evaluation, 97750- Physical Performance Testing, 97110-Therapeutic exercises, 97530- Therapeutic activity, [id number]- Neuromuscular re-education, 97535- Self Care, [id number]- Manual therapy, [id number]- Gait training, [phone]- Orthotic Initial, [phone]- Prosthetic Initial , [phone]- Orthotic/Prosthetic subsequent, [id number]- Electrical stimulation (unattended), [phone]- Electrical stimulation (manual), [phone] (1-2 muscles), [id number] (3+ muscles)- Dry Needling, Patient/Family education, Balance training, Stair training, Joint mobilization, Joint manipulation, Spinal manipulation, Spinal mobilization, and DME instructions  PLAN FOR NEXT SESSION: Functional mobility, balance, bilateral extremity strengthening, e-stim for ankle dorsiflexion on R side, possible e-stim for R hams, follow up on RW and orthotist rec    5:05 PM, 04/25/24 [name], PT, DPT [facility] [place]

## 2024-04-29 ENCOUNTER — Encounter (HOSPITAL_COMMUNITY)

## 2024-05-01 ENCOUNTER — Ambulatory Visit (HOSPITAL_COMMUNITY)

## 2024-05-01 ENCOUNTER — Encounter (HOSPITAL_COMMUNITY): Payer: Self-pay

## 2024-05-01 DIAGNOSIS — Z7409 Other reduced mobility: Secondary | ICD-10-CM | POA: Diagnosis not present

## 2024-05-01 DIAGNOSIS — R29898 Other symptoms and signs involving the musculoskeletal system: Secondary | ICD-10-CM

## 2024-05-01 NOTE — Therapy (Signed)
 OUTPATIENT PHYSICAL THERAPY LOWER EXTREMITY TREATMENT/PROGRESS NOTE   Patient Name: Aaron Medina MRN: [id number] DOB:28-Apr-1961, 63 y.o., male Today's Date: 05/01/2024  Progress Note Reporting Period 03/18/24 to 04/25/24  See note below for Objective Data and Assessment of Progress/Goals.      END OF SESSION:  PT End of Session - 05/01/24 1610     Visit Number 7    Number of Visits 12    Date for PT Re-Evaluation 05/29/24    Authorization Type Medicare a&B    Authorization Time Period No auth, no limit    Progress Note Due on Visit 10    PT Start Time 1605    PT Stop Time 1646    PT Time Calculation (min) 41 min    Activity Tolerance Patient tolerated treatment well    Behavior During Therapy WFL for tasks assessed/performed           Past Medical History:  Diagnosis Date   Acute coronary syndrome (HCC)    Occurred in the 1980s in the setting of cocaine use   CAD (coronary artery disease)    Moderate proximal LAD disease associated with dissection and thrombus managed medically at [facility] in 1991   Hyperlipidemia    Hypertension    Lumbar spondylosis    Paroxysmal atrial fibrillation (HCC)    Stroke (HCC) 10/10/2013   Type 2 diabetes mellitus (HCC)    Past Surgical History:  Procedure Laterality Date   LYMPH NODE BIOPSY     Patient Active Problem List   Diagnosis Date Noted   Cerebrovascular accident (CVA) (HCC) 08/19/2021   Gait abnormality 08/19/2021   Right leg weakness 02/25/2019   Chronic midline low back pain with right-sided sciatica 02/25/2019   Spondylolisthesis 11/12/2014   Type 2 diabetes mellitus without complication (HCC) 07/29/2014   Embolic stroke involving right middle cerebral artery (HCC) 07/29/2014   PAF (paroxysmal atrial fibrillation) (HCC) 01/13/2014   History of CVA (cerebrovascular accident) 01/10/2014   OA (osteoarthritis) 06/23/2013   Thrombocytosis 03/21/2013   Essential hypertension, benign 03/18/2013   Hyperlipidemia  03/18/2013   Tobacco use 03/18/2013   Shoulder pain, bilateral 03/18/2013    PCP: Shona Norleen PEDLAR, MD  REFERRING PROVIDER: Hyacinth Honey, NP  REFERRING DIAG: Right leg weakness  THERAPY DIAG:  Right leg weakness  Impaired functional mobility, balance, gait, and endurance  Rationale for Evaluation and Treatment: Rehabilitation  ONSET DATE: long time  SUBJECTIVE:   SUBJECTIVE STATEMENT: Pt reports he's not having such a day. Reports he's been feeling pretty stiff the past few days. Reports he's been having more trouble with STS and been more slow.   EVAL: Recent onset within 2-3 years, pt's RLE weakness has gotten worse. Prolonged Lumbar back issues that MD recommended surgery years ago but pt did not go through with it. Pt's primary complaint in RLE weakness and R hip pain. Major difficulty with stair negotiation.   PERTINENT HISTORY: Stroke/CVA about 10 years ago Lumbar disc L4-L5 protrusion  Diabetic PAIN:  Are you having pain? No general aches and pains  PRECAUTIONS: None  RED FLAGS: None   WEIGHT BEARING RESTRICTIONS: No  FALLS:  Has patient fallen in last 6 months? No  PATIENT GOALS: I want to gain more manuveurability   OBJECTIVE:  Note: Objective measures were completed at Evaluation unless otherwise noted.  DIAGNOSTIC FINDINGS:  MRI of lumbar from [facility] on April 21, 2021, mild L4-5 spinal stenosis, small to moderate left median directly synovial cyst contents upon left lateral  thecal sac, with mild ventral displacement of the descending L4 nerve roots, moderate severe bilateral neuroforaminal narrowing impinging upon and displacing bilateral L4 nerve roots. Moderate to severe right and mild to moderate left L5-S1 neuroforaminal narrowing, impinging upon the right lateral V nerve roots, without spinal stenosis, L3-4, no significant canal or foraminal stenosis multilevel degenerative changes   PATIENT SURVEYS:  LEFS: 16/80 = 20%  05/01/24: Lower  Extremity Functional Score: 42 / 80 = 52.5 %  COGNITION: Overall cognitive status: Within functional limits for tasks assessed     SENSATION: Light touch: Impaired  and Decreased light touch in dermatomal pattern L3-4 Proprioception: WFL  POSTURE: decreased lumbar lordosis and flexed trunk   PALPATION:   LOWER EXTREMITY ROM:  Active ROM Right eval Left eval  Hip flexion    Hip extension    Hip abduction    Hip adduction    Hip internal rotation    Hip external rotation    Knee flexion    Knee extension    Ankle dorsiflexion    Ankle plantarflexion    Ankle inversion    Ankle eversion     (Blank rows = not tested)  LOWER EXTREMITY MMT:  MMT Right eval Left eval Right  05/01/24  Hip flexion 2+ 3+ 2+  Hip extension     Hip abduction 3 4-   Hip adduction     Hip internal rotation     Hip external rotation     Knee flexion 3- 4- 3  Knee extension 3+ 4+ 3  Ankle dorsiflexion 0 4- 0  Ankle plantarflexion     Ankle inversion     Ankle eversion      (Blank rows = not tested)   FUNCTIONAL TESTS:  Timed up and go (TUG): 63 seconds w/ RW  30 Second Chair Stand Test: 5x  05/01/24: TUG - 57 seconds, with walking stick and SPC 30 second chair stand test: 6 STS, inc knee hyperext. on R, generally unsteady  Norms:   Age 56-64 67-69 11-74 50-79 61-84 54-89 58-94  Women 15 15 14 13 12 11 9   Men 17 16 15 14 13 11 9    L SLS: 4.49 seconds  R SLS: 1.38 seconds  Norms: 18-39  F: 43.5 seconds  M: 43.2 seconds 40-49  F: 40.4 seconds  M: 40.1 seconds 50-59  F: 36 seconds  M: 38.1 seconds 60-69  F: 25.1 seconds  M: 28.7 seconds 70-79  F: 11.3 seconds  M: 18.3 seconds  GAIT: Distance walked: 57ft Assistive device utilized: bilateral single point cane Level of assistance: SBA Comments: Bilateral single-point cane ambulation with step to pattern increased forward trunk flexion.                                                                                                                                 TREATMENT DATE:  05/01/24: NuStep, seat 13, level 5 for 2.5 min, level  7 for 2.5 min Progress Note:  LEFS LE MMT TUG 30 Second Chair Stand test Review of goals Discussion of POC and options   04/25/24: Nustep, 5 minutes, level 5 resistance, pt cued for 100 spm Sit to stands, feet on foam, BUE --> one UE support, 10x Hip Vectors, 10x each side, 2 lb. AW, inc lean when performing with RLE.  Heel raises, 3x10 Standing R Hip Isometrics against wall, flex/abd/ext, 10 holds, 5-10x, support of chair for balance Side Steps, in // bars, 3x down and back, 2 lb AW   04/23/2024  Therapeutic Exercise: -Nustep, 5 minutes, level 4 resistance, pt cued for 100 spm -Leg Press, 3 sets of 10 reps, plate 5, plate 6, pt cued for avoidance of locking out knees, decreased knee valgus and eccentric control -Sit to stands, 2 sets of 4 reps, throughout session  Neuromuscular Re-education: -Standing heel raises, 2 sets of 15 reps -Bosu ball lunges, BUE support, 1 set of 7 reps bilaterally -Rocker board, DF/PF, EV/IV, 2 sets of 10 reps bilaterally -Cone touches, 2 sets of 10 reps bilaterally   PATIENT EDUCATION:  Education details: PT Evaluation, findings, prognosis, frequency, attendance policy, and requiring rolling walker for ambulation and increasing walking time throughout the day.. Person educated: Patient Education method: Medical illustrator Education comprehension: verbalized understanding  HOME EXERCISE PROGRAM: Access Code: [email or web address] URL: [email or web address] Date: 03/28/2024 Prepared by: Rosaria Powell-Butler  Exercises - Seated Long Arc Quad  - 2 x daily - 7 x weekly - 3 sets - 10 reps - Sit to Stand with Arm Reach Toward Target  - 2 x daily - 7 x weekly - 3 sets - 10 reps - Clamshell  - 2 x daily - 7 x weekly - 3 sets - 10 reps - Supine Bridge  - 2 x daily - 7 x weekly - 3 sets - 10 reps  Access Code:  [email or web address] D URL: [email or web address] Date: 04/08/2024 Prepared by: Rosaria Powell-Butler  Exercises - Hooklying Single Knee to Chest Stretch with Towel  - 3 x daily - 7 x weekly - 3 sets - 10 reps - Supine Double Knee to Chest  - 3 x daily - 7 x weekly - 3 sets - 10 reps  Access Code: [email or web address] URL: [email or web address] Date: 05/01/2024 Prepared by: Rosaria Powell-Butler  Exercises - Seated Soleus Stretch with Strap  - 2 x daily - 7 x weekly - 3 sets - 10 reps  ASSESSMENT:  CLINICAL IMPRESSION: Progress Note performed this date. Patient demonstrates little to no change since initial evaluation concerning LE strength, gait, and fall risk via TUG testing. However, patient demonstrates improved self-perceived function. Extensive time spent discussing POC from here. Will extend another 4 weeks but encouraged patient to truly consider all options as there seems to be no progress since evaluation, 6 weeks ago. Education given on prognosis of moderate-severe L5-S1 neuroforaminal narrowing along with previous history of CVA. Continued encourage of RW purchase/use, contacting Orthotist or MD for referral considering AFO, and everyday compliance with HEP and daily walking to see results from PT. Patient would continue to benefit from skilled physical therapy for increased endurance with ambulation, increased RLE strength, and improved balance for improved quality of life, improved independence with gait training and continued progress towards therapy goals.     Patient is a 62y.o. male who was seen today for physical therapy evaluation and treatment for ' right left weakness.'  Patient with history of low back pain, and right sided MCA.  Patient reporting  new onset of right lower extremity muscle weakness and muscle atrophy impacting capacity for movement.  Patient shows significant right lower extremity weakness compared to left side with right foot drop noted.  Patient wants  to get stronger patient with significant functional mobility deficits impairing endurance, fall risk, transfer capacity, and ADLs due to bilateral lower extremity muscle weakness, right side greater than left, significant balance deficits, reduced postural and balance reactions, reduced ankle range of motion, reduced light touch sensation. Pt will benefit from skilled Physical Therapy services to address deficits/limitations in order to improve functional and QOL.    OBJECTIVE IMPAIRMENTS: Abnormal gait, decreased activity tolerance, decreased balance, decreased endurance, decreased mobility, difficulty walking, decreased ROM, decreased strength, impaired flexibility, impaired sensation, improper body mechanics, and postural dysfunction.   ACTIVITY LIMITATIONS: carrying, lifting, sitting, standing, squatting, stairs, transfers, bed mobility, and locomotion level  PARTICIPATION LIMITATIONS: driving, shopping, community activity, occupation, yard work, school, and church  PERSONAL FACTORS: Age and 3+ comorbidities: see EMR for details.  are also affecting patient's functional outcome.   REHAB POTENTIAL: Fair hx of stroke and L4-5 Spinal Stenosis  CLINICAL DECISION MAKING: Evolving/moderate complexity  EVALUATION COMPLEXITY: Low   GOALS: Goals reviewed with patient? Yes  SHORT TERM GOALS: Target date: 05/15/24  Pt will be independent with HEP in order to demonstrate participation in Physical Therapy POC.  Baseline: Reports compliance as every other day Goal status: IN PROGRESS  2.  Pt will improve LEFS score by 10 %  in order to demonstrate improved pain with functional goals and outcomes. Baseline:  Goal status: MET  LONG TERM GOALS: Target date: 05/29/24  Pt will improve 30 Second Chair Stand Test by at least 2 reps in order to demonstrate improved functional strength to return to desired activities.  Baseline: see objective.  Goal status: IN PROGRESS  2.  Pt will improve TUG by at  least 10 seconds in order to demonstrate improved functional ambulatory capacity in community setting.  Baseline: see objective.  Goal status: IN PROGRESS  3.  Pt will improve LEFS score by 20% in order to demonstrate improved pain with functional goals and outcomes. Baseline: see objective.  Goal status: IN PROGRESS  4.  Pt will improve BLE MMT by at least 1/2 grade in order to improve safety during functional activities.. Baseline: see objective.  Goal status: IN PROGRESS  5.  Pt will tolerate at least 3 minutes of ambulation at modified independent level to demonstrate improved functional mobility, endurance, and safety.  Baseline: See objective.  Goal status: IN PROGRESS   PLAN:  PT FREQUENCY: 2x/week  PT DURATION: 6 weeks  PLANNED INTERVENTIONS: 97164- PT Re-evaluation, 97750- Physical Performance Testing, 97110-Therapeutic exercises, 97530- Therapeutic activity, [id number]- Neuromuscular re-education, 97535- Self Care, [id number]- Manual therapy, [phone]- Gait training, [phone]- Orthotic Initial, [phone]- Prosthetic Initial , [phone]- Orthotic/Prosthetic subsequent, [id number]- Electrical stimulation (unattended), [phone]- Electrical stimulation (manual), [phone] (1-2 muscles), [id number] (3+ muscles)- Dry Needling, Patient/Family education, Balance training, Stair training, Joint mobilization, Joint manipulation, Spinal manipulation, Spinal mobilization, and DME instructions  PLAN FOR NEXT SESSION: Functional mobility, balance, bilateral extremity strengthening, e-stim for ankle dorsiflexion on R side, possible e-stim for R hams, follow up on RW and orthotist rec, EXTEND 2X/4WK     5:46 PM, 05/01/24 [name], PT, DPT [facility] [place]

## 2024-05-06 ENCOUNTER — Encounter (HOSPITAL_COMMUNITY)

## 2024-05-08 ENCOUNTER — Encounter (HOSPITAL_COMMUNITY): Payer: Self-pay

## 2024-05-08 ENCOUNTER — Ambulatory Visit (HOSPITAL_COMMUNITY)

## 2024-05-08 DIAGNOSIS — Z7409 Other reduced mobility: Secondary | ICD-10-CM | POA: Diagnosis not present

## 2024-05-08 DIAGNOSIS — R29898 Other symptoms and signs involving the musculoskeletal system: Secondary | ICD-10-CM

## 2024-05-08 NOTE — Therapy (Signed)
 OUTPATIENT PHYSICAL THERAPY LOWER EXTREMITY TREATMENT   Patient Name: Aaron Medina MRN: [id number] DOB:03/10/1961, 64 y.o., male Today's Date: 05/08/2024   END OF SESSION:  PT End of Session - 05/08/24 1558     Visit Number 8    Number of Visits 12    Date for PT Re-Evaluation 05/29/24    Authorization Type Medicare a&B    Authorization Time Period No auth, no limit    PT Start Time 1558    PT Stop Time 1644    PT Time Calculation (min) 46 min    Activity Tolerance Patient tolerated treatment well    Behavior During Therapy WFL for tasks assessed/performed           Past Medical History:  Diagnosis Date   Acute coronary syndrome (HCC)    Occurred in the 1980s in the setting of cocaine use   CAD (coronary artery disease)    Moderate proximal LAD disease associated with dissection and thrombus managed medically at [facility] in 1991   Hyperlipidemia    Hypertension    Lumbar spondylosis    Paroxysmal atrial fibrillation (HCC)    Stroke (HCC) 10/10/2013   Type 2 diabetes mellitus (HCC)    Past Surgical History:  Procedure Laterality Date   LYMPH NODE BIOPSY     Patient Active Problem List   Diagnosis Date Noted   Cerebrovascular accident (CVA) (HCC) 08/19/2021   Gait abnormality 08/19/2021   Right leg weakness 02/25/2019   Chronic midline low back pain with right-sided sciatica 02/25/2019   Spondylolisthesis 11/12/2014   Type 2 diabetes mellitus without complication (HCC) 07/29/2014   Embolic stroke involving right middle cerebral artery (HCC) 07/29/2014   PAF (paroxysmal atrial fibrillation) (HCC) 01/13/2014   History of CVA (cerebrovascular accident) 01/10/2014   OA (osteoarthritis) 06/23/2013   Thrombocytosis 03/21/2013   Essential hypertension, benign 03/18/2013   Hyperlipidemia 03/18/2013   Tobacco use 03/18/2013   Shoulder pain, bilateral 03/18/2013    PCP: Shona Norleen PEDLAR, MD  REFERRING PROVIDER: Hyacinth Honey, NP  REFERRING DIAG: Right leg  weakness  THERAPY DIAG:  Right leg weakness  Impaired functional mobility, balance, gait, and endurance  Rationale for Evaluation and Treatment: Rehabilitation  ONSET DATE: long time  SUBJECTIVE:   SUBJECTIVE STATEMENT: Pt reports his R knee feels more stiff today, about 6/10. Reports he's having a rougher day today. Reports he did a lot more mowing and outside work the past few days.    EVAL: Recent onset within 2-3 years, pt's RLE weakness has gotten worse. Prolonged Lumbar back issues that MD recommended surgery years ago but pt did not go through with it. Pt's primary complaint in RLE weakness and R hip pain. Major difficulty with stair negotiation.   PERTINENT HISTORY: Stroke/CVA about 10 years ago Lumbar disc L4-L5 protrusion  Diabetic PAIN:  Are you having pain? No general aches and pains  PRECAUTIONS: None  RED FLAGS: None   WEIGHT BEARING RESTRICTIONS: No  FALLS:  Has patient fallen in last 6 months? No  PATIENT GOALS: I want to gain more manuveurability   OBJECTIVE:  Note: Objective measures were completed at Evaluation unless otherwise noted.  DIAGNOSTIC FINDINGS:  MRI of lumbar from [facility] on April 21, 2021, mild L4-5 spinal stenosis, small to moderate left median directly synovial cyst contents upon left lateral thecal sac, with mild ventral displacement of the descending L4 nerve roots, moderate severe bilateral neuroforaminal narrowing impinging upon and displacing bilateral L4 nerve roots. Moderate to severe right  and mild to moderate left L5-S1 neuroforaminal narrowing, impinging upon the right lateral V nerve roots, without spinal stenosis, L3-4, no significant canal or foraminal stenosis multilevel degenerative changes   PATIENT SURVEYS:  LEFS: 16/80 = 20%  05/01/24: Lower Extremity Functional Score: 42 / 80 = 52.5 %  COGNITION: Overall cognitive status: Within functional limits for tasks assessed     SENSATION: Light touch: Impaired   and Decreased light touch in dermatomal pattern L3-4 Proprioception: WFL  POSTURE: decreased lumbar lordosis and flexed trunk   PALPATION:   LOWER EXTREMITY ROM:  Active ROM Right eval Left eval  Hip flexion    Hip extension    Hip abduction    Hip adduction    Hip internal rotation    Hip external rotation    Knee flexion    Knee extension    Ankle dorsiflexion    Ankle plantarflexion    Ankle inversion    Ankle eversion     (Blank rows = not tested)  LOWER EXTREMITY MMT:  MMT Right eval Left eval Right  05/01/24  Hip flexion 2+ 3+ 2+  Hip extension     Hip abduction 3 4-   Hip adduction     Hip internal rotation     Hip external rotation     Knee flexion 3- 4- 3  Knee extension 3+ 4+ 3  Ankle dorsiflexion 0 4- 0  Ankle plantarflexion     Ankle inversion     Ankle eversion      (Blank rows = not tested)   FUNCTIONAL TESTS:  Timed up and go (TUG): 63 seconds w/ RW  30 Second Chair Stand Test: 5x  05/01/24: TUG - 57 seconds, with walking stick and SPC 30 second chair stand test: 6 STS, inc knee hyperext. on R, generally unsteady  Norms:   Age 86-64 74-69 12-74 96-79 55-84 34-89 15-94  Women 15 15 14 13 12 11 9   Men 17 16 15 14 13 11 9    L SLS: 4.49 seconds  R SLS: 1.38 seconds  Norms: 18-39  F: 43.5 seconds  M: 43.2 seconds 40-49  F: 40.4 seconds  M: 40.1 seconds 50-59  F: 36 seconds  M: 38.1 seconds 60-69  F: 25.1 seconds  M: 28.7 seconds 70-79  F: 11.3 seconds  M: 18.3 seconds  GAIT: Distance walked: 38ft Assistive device utilized: bilateral single point cane Level of assistance: SBA Comments: Bilateral single-point cane ambulation with step to pattern increased forward trunk flexion.                                                                                                                                TREATMENT DATE:  NuStep, seat 14, 7.5 min, level 5, LE only Ambulation trial with walking sticks, 226 ft Lateral step  up/downs, 12 inch stair case, 10x2, PT blocking R knee ext Heel raises, 2x10 at steps Staggered STS, 10x  10x  w/ GTB around knees Given information/print off of Walking Stick options  05/01/24: NuStep, seat 13, level 5 for 2.5 min, level 7 for 2.5 min Progress Note:  LEFS LE MMT TUG 30 Second Chair Stand test Review of goals Discussion of POC and options   04/25/24: Nustep, 5 minutes, level 5 resistance, pt cued for 100 spm Sit to stands, feet on foam, BUE --> one UE support, 10x Hip Vectors, 10x each side, 2 lb. AW, inc lean when performing with RLE.  Heel raises, 3x10 Standing R Hip Isometrics against wall, flex/abd/ext, 10 holds, 5-10x, support of chair for balance Side Steps, in // bars, 3x down and back, 2 lb AW   PATIENT EDUCATION:  Education details: PT Evaluation, findings, prognosis, frequency, attendance policy, and requiring rolling walker for ambulation and increasing walking time throughout the day.. Person educated: Patient Education method: Medical illustrator Education comprehension: verbalized understanding  HOME EXERCISE PROGRAM: Access Code: [email or web address] URL: [email or web address] Date: 03/28/2024 Prepared by: Rosaria Powell-Butler  Exercises - Seated Long Arc Quad  - 2 x daily - 7 x weekly - 3 sets - 10 reps - Sit to Stand with Arm Reach Toward Target  - 2 x daily - 7 x weekly - 3 sets - 10 reps - Clamshell  - 2 x daily - 7 x weekly - 3 sets - 10 reps - Supine Bridge  - 2 x daily - 7 x weekly - 3 sets - 10 reps  Access Code: [email or web address] D URL: [email or web address] Date: 04/08/2024 Prepared by: Rosaria Powell-Butler  Exercises - Hooklying Single Knee to Chest Stretch with Towel  - 3 x daily - 7 x weekly - 3 sets - 10 reps - Supine Double Knee to Chest  - 3 x daily - 7 x weekly - 3 sets - 10 reps  Access Code: [email or web address] URL: [email or web address] Date: 05/01/2024 Prepared by: Rosaria  Powell-Butler  Exercises - Seated Soleus Stretch with Strap  - 2 x daily - 7 x weekly - 3 sets - 10 reps  ASSESSMENT:  CLINICAL IMPRESSION: Began session with NuStep, LE only at increased resistance level for strengthening. Follow with gait training with bilateral walking sticks. Patient demo increased speed, better upright posture, and improved confidence during ambulation. Print out given.  Followed with lateral steps. Inc difficulty with preventing hyperextension of R knee. PT providing mod-max blocking during. RLE shaking occurring due to fatigue. Ended with staggered stance STS with RLE emphasized. Knee valgus noted and improves with addition of GTB at knees. Pt demo appropriate fatigue at EOS. Extended PT 2x/4wk. Patient would continue to benefit from skilled physical therapy for increased endurance with ambulation, increased RLE strength, and improved balance for improved quality of life, improved independence with gait training and continued progress towards therapy goals.    EVAL: Patient is a 62y.o. male who was seen today for physical therapy evaluation and treatment for ' right left weakness.'  Patient with history of low back pain, and right sided MCA.  Patient reporting new onset of right lower extremity muscle weakness and muscle atrophy impacting capacity for movement.  Patient shows significant right lower extremity weakness compared to left side with right foot drop noted.  Patient wants to get stronger patient with significant functional mobility deficits impairing endurance, fall risk, transfer capacity, and ADLs due to bilateral lower extremity muscle weakness, right side greater than left, significant balance deficits, reduced postural and balance reactions, reduced ankle range of motion, reduced light touch sensation. Pt will benefit from skilled  Physical Therapy services to address deficits/limitations in order to improve functional and QOL.    OBJECTIVE IMPAIRMENTS: Abnormal  gait, decreased activity tolerance, decreased balance, decreased endurance, decreased mobility, difficulty walking, decreased ROM, decreased strength, impaired flexibility, impaired sensation, improper body mechanics, and postural dysfunction.   ACTIVITY LIMITATIONS: carrying, lifting, sitting, standing, squatting, stairs, transfers, bed mobility, and locomotion level  PARTICIPATION LIMITATIONS: driving, shopping, community activity, occupation, yard work, school, and church  PERSONAL FACTORS: Age and 3+ comorbidities: see EMR for details.  are also affecting patient's functional outcome.   REHAB POTENTIAL: Fair hx of stroke and L4-5 Spinal Stenosis  CLINICAL DECISION MAKING: Evolving/moderate complexity  EVALUATION COMPLEXITY: Low   GOALS: Goals reviewed with patient? Yes  SHORT TERM GOALS: Target date: 05/15/24  Pt will be independent with HEP in order to demonstrate participation in Physical Therapy POC.  Baseline: Reports compliance as every other day Goal status: IN PROGRESS  2.  Pt will improve LEFS score by 10 %  in order to demonstrate improved pain with functional goals and outcomes. Baseline:  Goal status: MET  LONG TERM GOALS: Target date: 05/29/24  Pt will improve 30 Second Chair Stand Test by at least 2 reps in order to demonstrate improved functional strength to return to desired activities.  Baseline: see objective.  Goal status: IN PROGRESS  2.  Pt will improve TUG by at least 10 seconds in order to demonstrate improved functional ambulatory capacity in community setting.  Baseline: see objective.  Goal status: IN PROGRESS  3.  Pt will improve LEFS score by 20% in order to demonstrate improved pain with functional goals and outcomes. Baseline: see objective.  Goal status: IN PROGRESS  4.  Pt will improve BLE MMT by at least 1/2 grade in order to improve safety during functional activities.. Baseline: see objective.  Goal status: IN PROGRESS  5.  Pt will  tolerate at least 3 minutes of ambulation at modified independent level to demonstrate improved functional mobility, endurance, and safety.  Baseline: See objective.  Goal status: IN PROGRESS   PLAN:  PT FREQUENCY: 2x/week  PT DURATION: 6 weeks  PLANNED INTERVENTIONS: 97164- PT Re-evaluation, 97750- Physical Performance Testing, 97110-Therapeutic exercises, 97530- Therapeutic activity, [id number]- Neuromuscular re-education, 97535- Self Care, 02859- Manual therapy, [phone]- Gait training, [phone]- Orthotic Initial, [phone]- Prosthetic Initial , [phone]- Orthotic/Prosthetic subsequent, [id number]- Electrical stimulation (unattended), [phone]- Electrical stimulation (manual), [phone] (1-2 muscles), [id number] (3+ muscles)- Dry Needling, Patient/Family education, Balance training, Stair training, Joint mobilization, Joint manipulation, Spinal manipulation, Spinal mobilization, and DME instructions  PLAN FOR NEXT SESSION: Functional mobility, balance, bilateral extremity strengthening, e-stim for ankle dorsiflexion on R side, possible e-stim for R hams, follow up on RW and orthotist rec     4:46 PM, 05/08/24 [name], PT, DPT [facility] [place]

## 2024-05-17 ENCOUNTER — Encounter (HOSPITAL_COMMUNITY): Payer: Self-pay

## 2024-05-17 ENCOUNTER — Encounter (HOSPITAL_COMMUNITY)

## 2024-05-17 NOTE — Therapy (Signed)
 [facility] Outpatient Rehabilitation at [facility] [address] [place], [place], [address] Phone: [phone]   Fax:  [phone]  Patient Details  Name: Aaron Medina MRN: [id number] Date of Birth: [birth year] Referring Provider:  No ref. provider found  Encounter Date: 05/17/2024  Pt was called concerning his missed appointment this afternoon and was reminded of the no show policy and his next appointment date.    Lang Ada, PT, DPT [facility] Office: [phone] 2:10 PM, 05/17/24   [facility] at [facility] [address] [place], [place], [address] Phone: [phone]   Fax:  [phone]

## 2024-05-24 ENCOUNTER — Encounter (HOSPITAL_COMMUNITY)

## 2024-05-27 ENCOUNTER — Telehealth (HOSPITAL_COMMUNITY): Payer: Self-pay | Admitting: Occupational Therapy

## 2024-05-27 NOTE — Telephone Encounter (Signed)
 Called pt to offer waitlist spot for week of 8/18. Pt reports he is having car problems and will need to wait until his next appt on 8/27 to attend.   Sonny Cory, OTR/L  [phone] [facility].  05/27/24

## 2024-06-05 ENCOUNTER — Ambulatory Visit (HOSPITAL_COMMUNITY)

## 2024-06-12 ENCOUNTER — Telehealth (HOSPITAL_COMMUNITY): Payer: Self-pay | Admitting: Physical Therapy

## 2024-06-12 ENCOUNTER — Ambulatory Visit (HOSPITAL_COMMUNITY): Attending: Family Medicine | Admitting: Physical Therapy

## 2024-06-12 NOTE — Telephone Encounter (Signed)
 Pt did not show for appt (3rd NS).  Called and spoke to pt who states he has been unable to come due to his legs being weak.  Explained to pt that is why he was sent to us  and encouraged to continue his HEP.  Pt reports he would like to go back to MD before continuing therapy because something is not right.  Removed remaining appts and sent note to evaluating therapist to discharge.    Greig KATHEE Fuse, PTA/CLT [facility] Ph: [phone]

## 2024-06-12 NOTE — Therapy (Signed)
 PHYSICAL THERAPY DISCHARGE SUMMARY  Visits from Start of Care: 8  Current functional level related to goals / functional outcomes: See last visit on 05/08/24   Remaining deficits: See last visit on 05/08/24   Education / Equipment: HEP   Patient agrees to discharge. Patient goals were not met. Patient is being discharged due to not returning since the last visit.  Pt discharged per No Show policy. Treating therapist called patient and patient reported that his LE were feeling extremely weak and that he planned to return to MD because something isn't right. Pt discharged  6:00 PM, 06/12/24 [name], PT, DPT [facility] [place]

## 2024-06-14 ENCOUNTER — Ambulatory Visit (HOSPITAL_COMMUNITY)

## 2024-06-18 ENCOUNTER — Encounter (HOSPITAL_COMMUNITY)

## 2024-06-20 ENCOUNTER — Encounter (HOSPITAL_COMMUNITY)

## 2024-06-24 ENCOUNTER — Encounter (HOSPITAL_COMMUNITY)

## 2024-06-26 ENCOUNTER — Encounter (HOSPITAL_COMMUNITY)

## 2024-08-29 DIAGNOSIS — E1165 Type 2 diabetes mellitus with hyperglycemia: Secondary | ICD-10-CM | POA: Diagnosis not present

## 2024-08-29 DIAGNOSIS — I10 Essential (primary) hypertension: Secondary | ICD-10-CM | POA: Diagnosis not present

## 2024-09-09 ENCOUNTER — Encounter (HOSPITAL_COMMUNITY): Payer: Self-pay | Admitting: Internal Medicine

## 2024-09-09 DIAGNOSIS — F17210 Nicotine dependence, cigarettes, uncomplicated: Secondary | ICD-10-CM | POA: Diagnosis not present

## 2024-09-09 DIAGNOSIS — I10 Essential (primary) hypertension: Secondary | ICD-10-CM | POA: Diagnosis not present

## 2024-09-09 DIAGNOSIS — E785 Hyperlipidemia, unspecified: Secondary | ICD-10-CM | POA: Diagnosis not present

## 2024-09-09 DIAGNOSIS — M47896 Other spondylosis, lumbar region: Secondary | ICD-10-CM | POA: Diagnosis not present

## 2024-09-09 DIAGNOSIS — F17200 Nicotine dependence, unspecified, uncomplicated: Secondary | ICD-10-CM

## 2024-09-09 DIAGNOSIS — E1165 Type 2 diabetes mellitus with hyperglycemia: Secondary | ICD-10-CM | POA: Diagnosis not present

## 2024-09-10 ENCOUNTER — Other Ambulatory Visit (HOSPITAL_COMMUNITY): Payer: Self-pay | Admitting: Internal Medicine

## 2024-09-10 DIAGNOSIS — E1165 Type 2 diabetes mellitus with hyperglycemia: Secondary | ICD-10-CM | POA: Diagnosis not present

## 2024-09-10 DIAGNOSIS — Z87891 Personal history of nicotine dependence: Secondary | ICD-10-CM

## 2024-09-10 DIAGNOSIS — Z122 Encounter for screening for malignant neoplasm of respiratory organs: Secondary | ICD-10-CM

## 2024-09-10 DIAGNOSIS — F17200 Nicotine dependence, unspecified, uncomplicated: Secondary | ICD-10-CM

## 2024-09-19 ENCOUNTER — Ambulatory Visit: Admitting: Internal Medicine

## 2024-09-22 ENCOUNTER — Ambulatory Visit (HOSPITAL_COMMUNITY): Admission: RE | Admit: 2024-09-22 | Source: Ambulatory Visit

## 2024-10-18 ENCOUNTER — Ambulatory Visit (HOSPITAL_COMMUNITY)
Admission: RE | Admit: 2024-10-18 | Discharge: 2024-10-18 | Disposition: A | Discharge status: Home / Self Care | Source: Ambulatory Visit | Attending: Internal Medicine | Admitting: Internal Medicine

## 2024-10-18 DIAGNOSIS — F17200 Nicotine dependence, unspecified, uncomplicated: Secondary | ICD-10-CM | POA: Diagnosis present

## 2024-10-18 DIAGNOSIS — Z122 Encounter for screening for malignant neoplasm of respiratory organs: Secondary | ICD-10-CM | POA: Diagnosis present

## 2024-10-18 DIAGNOSIS — Z87891 Personal history of nicotine dependence: Secondary | ICD-10-CM | POA: Insufficient documentation

## 2024-11-25 ENCOUNTER — Ambulatory Visit: Admitting: Internal Medicine
